# Patient Record
Sex: Male | Born: 1985 | Race: White | Hispanic: No | Marital: Married | Smoking: Former smoker
Health system: Southern US, Community
[De-identification: ages and names within clinical notes are randomized; demographics above are authoritative.]

## PROBLEM LIST (undated history)

## (undated) HISTORY — PX: TONSILLECTOMY: SUR1361

---
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
## 2010-06-13 ENCOUNTER — Encounter: Payer: Self-pay | Admitting: Orthopedic Surgery

## 2011-01-31 ENCOUNTER — Encounter: Payer: Self-pay | Admitting: *Deleted

## 2011-01-31 ENCOUNTER — Emergency Department (HOSPITAL_COMMUNITY): Payer: Self-pay

## 2011-01-31 ENCOUNTER — Emergency Department (HOSPITAL_COMMUNITY)
Admission: EM | Admit: 2011-01-31 | Discharge: 2011-01-31 | Disposition: A | Discharge status: Home / Self Care | Payer: Self-pay | Attending: Emergency Medicine | Admitting: Emergency Medicine

## 2011-01-31 DIAGNOSIS — R109 Unspecified abdominal pain: Secondary | ICD-10-CM | POA: Insufficient documentation

## 2011-01-31 DIAGNOSIS — R112 Nausea with vomiting, unspecified: Secondary | ICD-10-CM | POA: Insufficient documentation

## 2011-01-31 DIAGNOSIS — R197 Diarrhea, unspecified: Secondary | ICD-10-CM | POA: Insufficient documentation

## 2011-01-31 LAB — COMPREHENSIVE METABOLIC PANEL
ALT: 30 U/L (ref 0–53)
AST: 24 U/L (ref 0–37)
Albumin: 4.4 g/dL (ref 3.5–5.2)
Alkaline Phosphatase: 107 U/L (ref 39–117)
BUN: 13 mg/dL (ref 6–23)
CO2: 29 mEq/L (ref 19–32)
Calcium: 9.5 mg/dL (ref 8.4–10.5)
Chloride: 101 mEq/L (ref 96–112)
Creatinine, Ser: 0.95 mg/dL (ref 0.50–1.35)
GFR calc Af Amer: >60 mL/min (ref >60)
GFR calc non Af Amer: >60 mL/min (ref >60)
Glucose, Bld: 94 mg/dL (ref 70–99)
Potassium: 3.9 mEq/L (ref 3.5–5.1)
Sodium: 137 mEq/L (ref 135–145)
Total Bilirubin: 0.5 mg/dL (ref 0.3–1.2)
Total Protein: 7.3 g/dL (ref 6.0–8.3)

## 2011-01-31 LAB — URINALYSIS, ROUTINE W REFLEX MICROSCOPIC
Bilirubin Urine: NEGATIVE
Glucose, UA: NEGATIVE mg/dL
Hgb urine dipstick: NEGATIVE
Ketones, ur: NEGATIVE mg/dL
Leukocytes, UA: NEGATIVE
Nitrite: NEGATIVE
Protein, ur: NEGATIVE mg/dL
Specific Gravity, Urine: 1.025 (ref 1.005–1.030)
Urobilinogen, UA: 0.2 mg/dL (ref 0.0–1.0)
pH: 5.5 (ref 5.0–8.0)

## 2011-01-31 LAB — DIFFERENTIAL
Basophils Absolute: 0.1 10*3/uL (ref 0.0–0.1)
Basophils Relative: 1 % (ref 0–1)
Eosinophils Absolute: 0.3 10*3/uL (ref 0.0–0.7)
Eosinophils Relative: 4 % (ref 0–5)
Lymphocytes Relative: 32 % (ref 12–46)
Lymphs Abs: 2.1 10*3/uL (ref 0.7–4.0)
Monocytes Absolute: 0.4 10*3/uL (ref 0.1–1.0)
Monocytes Relative: 6 % (ref 3–12)
Neutro Abs: 3.8 10*3/uL (ref 1.7–7.7)
Neutrophils Relative %: 58 % (ref 43–77)

## 2011-01-31 LAB — CBC
HCT: 48.7 % (ref 39.0–52.0)
Hemoglobin: 17.2 g/dL — ABNORMAL HIGH (ref 13.0–17.0)
MCH: 28.4 pg (ref 26.0–34.0)
MCHC: 35.3 g/dL (ref 30.0–36.0)
MCV: 80.4 fL (ref 78.0–100.0)
Platelets: 306 10*3/uL (ref 150–400)
RBC: 6.06 MIL/uL — ABNORMAL HIGH (ref 4.22–5.81)
RDW: 12.5 % (ref 11.5–15.5)
WBC: 6.5 10*3/uL (ref 4.0–10.5)

## 2011-01-31 LAB — LIPASE, BLOOD: Lipase: 24 U/L (ref 11–59)

## 2011-01-31 MED ORDER — ONDANSETRON HCL 4 MG/2ML IJ SOLN
4.0000 mg | INTRAMUSCULAR | Status: DC | PRN
  Administered 2011-01-31: 4 mg via INTRAVENOUS
  Filled 2011-01-31: qty 2

## 2011-01-31 MED ORDER — SODIUM CHLORIDE 0.9 % IV BOLUS (SEPSIS)
1000.0000 mL | Freq: Once | INTRAVENOUS | Status: AC
  Administered 2011-01-31: 1000 mL via INTRAVENOUS

## 2011-01-31 MED ORDER — FAMOTIDINE 40 MG/5ML PO SUSR: 0.5000 mg/kg | Freq: Once | ORAL | Status: DC

## 2011-01-31 MED ORDER — ONDANSETRON HCL 4 MG PO TABS: 4.0000 mg | ORAL_TABLET | Freq: Four times a day (QID) | ORAL | Status: AC | PRN

## 2011-01-31 MED ORDER — FAMOTIDINE IN NACL 20-0.9 MG/50ML-% IV SOLN
20.0000 mg | Freq: Once | INTRAVENOUS | Status: AC
  Administered 2011-01-31: 20 mg via INTRAVENOUS
  Filled 2011-01-31: qty 50

## 2011-01-31 NOTE — ED Provider Notes (Signed)
History     CSN: [id number] Arrival date & time: 01/31/2011  6:20 PM  Chief Complaint  Patient presents with  . Abdominal Pain   HPI Pt was seen at 2010.  Per pt, c/o gradual onset and improvement in persistent upper abd "pain" x4 days.  Has been assoc with multiple intermittent daily episodes of N/V/D.  States the vomiting and diarrhea have improved, has been able to tol PO fluids despite nausea.  Denies back pain, no fevers, no CP/SOB, no rash, no black or bloody stools or emesis.     History reviewed. No pertinent past medical history.  Past Surgical History  Procedure Date  . Tonsillectomy      History  Substance Use Topics  . Smoking status: Never Smoker   . Smokeless tobacco: Not on file  . Alcohol Use: No     Review of Systems ROS: Statement: All systems negative except as marked or noted in the HPI; Constitutional: Negative for fever and chills.  Eyes: Negative for eye pain, redness and discharge. ; ; ENMT: Negative for ear pain, hoarseness, nasal congestion, sinus pressure and sore throat. ; ; Cardiovascular: Negative for chest pain, palpitations, diaphoresis, dyspnea and peripheral edema. ; ; Respiratory: Negative for cough, wheezing and stridor. ; ; Gastrointestinal: Positive for nausea, vomiting, diarrhea and abdominal pain, negative for blood in stool, hematemesis, jaundice and rectal bleeding. . ; ; Genitourinary: Negative for dysuria, flank pain and hematuria. ; ; Musculoskeletal: Negative for back pain and neck pain. Negative for swelling and trauma.; ; Skin: Negative for pruritus, rash, abrasions, blisters, bruising and skin lesion.; ; Neuro: Negative for headache, lightheadedness and neck stiffness. Negative for weakness, altered level of consciousness , altered mental status, extremity weakness, paresthesias, involuntary movement, seizure and syncope.     Physical Exam  BP 131/70  Pulse 63  Temp(Src) 98.6 F (37 C) (Oral)  Resp 19  Ht 5\' 11"  (1.803 m)  Wt 212  lb 6 oz (96.333 kg)  BMI 29.62 kg/m2  SpO2 100%  Physical Exam 2015: Physical examination:  Nursing notes reviewed; Vital signs and O2 SAT reviewed;  Constitutional: Well developed, Well nourished, Well hydrated, In no acute distress; Head:  Normocephalic, atraumatic; Eyes: EOMI, PERRL, No scleral icterus; ENMT: Mouth and pharynx normal, Mucous membranes moist; Neck: Supple, Full range of motion, No lymphadenopathy; Cardiovascular: Regular rate and rhythm, No murmur, rub, or gallop; Respiratory: Breath sounds clear & equal bilaterally, No rales, rhonchi, wheezes, or rub, Normal respiratory effort/excursion; Chest: Nontender, Movement normal; Abdomen: Soft, +tender mid-epigastric and LUQ to palp.  No rebound or guarding.  Nondistended, Normal bowel sounds; Extremities: Pulses normal, No tenderness, No edema, No calf edema or asymmetry.; Neuro: AA&Ox3, Major CN grossly intact.  No gross focal motor or sensory deficits in extremities.; Skin: Color normal, Warm, Dry   ED Course  Procedures  MDM MDM Reviewed: nursing note and vitals Interpretation: labs and x-ray   Results for orders placed during the hospital encounter of 01/31/11  COMPREHENSIVE METABOLIC PANEL      Component Value Range   Sodium 137  135 - 145 (mEq/L)   Potassium 3.9  3.5 - 5.1 (mEq/L)   Chloride 101  96 - 112 (mEq/L)   CO2 29  19 - 32 (mEq/L)   Glucose, Bld 94  70 - 99 (mg/dL)   BUN 13  6 - 23 (mg/dL)   Creatinine, Ser 4.09  0.50 - 1.35 (mg/dL)   Calcium 9.5  8.4 - 81.1 (mg/dL)  Total Protein 7.3  6.0 - 8.3 (g/dL)   Albumin 4.4  3.5 - 5.2 (g/dL)   AST 24  0 - 37 (U/L)   ALT 30  0 - 53 (U/L)   Alkaline Phosphatase 107  39 - 117 (U/L)   Total Bilirubin 0.5  0.3 - 1.2 (mg/dL)   GFR calc non Af Amer >60  >60 (mL/min)   GFR calc Af Amer >60  >60 (mL/min)  CBC      Component Value Range   WBC 6.5  4.0 - 10.5 (K/uL)   RBC 6.06 (*) 4.22 - 5.81 (MIL/uL)   Hemoglobin 17.2 (*) 13.0 - 17.0 (g/dL)   HCT 16.1  09.6 - 04.5  (%)   MCV 80.4  78.0 - 100.0 (fL)   MCH 28.4  26.0 - 34.0 (pg)   MCHC 35.3  30.0 - 36.0 (g/dL)   RDW 40.9  81.1 - 91.4 (%)   Platelets 306  150 - 400 (K/uL)  DIFFERENTIAL      Component Value Range   Neutrophils Relative 58  43 - 77 (%)   Neutro Abs 3.8  1.7 - 7.7 (K/uL)   Lymphocytes Relative 32  12 - 46 (%)   Lymphs Abs 2.1  0.7 - 4.0 (K/uL)   Monocytes Relative 6  3 - 12 (%)   Monocytes Absolute 0.4  0.1 - 1.0 (K/uL)   Eosinophils Relative 4  0 - 5 (%)   Eosinophils Absolute 0.3  0.0 - 0.7 (K/uL)   Basophils Relative 1  0 - 1 (%)   Basophils Absolute 0.1  0.0 - 0.1 (K/uL)  LIPASE, BLOOD      Component Value Range   Lipase 24  11 - 59 (U/L)  URINALYSIS, ROUTINE W REFLEX MICROSCOPIC      Component Value Range   Color, Urine YELLOW  YELLOW    Appearance CLEAR  CLEAR    Specific Gravity, Urine 1.025  1.005 - 1.030    pH 5.5  5.0 - 8.0    Glucose, UA NEGATIVE  NEGATIVE (mg/dL)   Hgb urine dipstick NEGATIVE  NEGATIVE    Bilirubin Urine NEGATIVE  NEGATIVE    Ketones, ur NEGATIVE  NEGATIVE (mg/dL)   Protein, ur NEGATIVE  NEGATIVE (mg/dL)   Urobilinogen, UA 0.2  0.0 - 1.0 (mg/dL)   Nitrite NEGATIVE  NEGATIVE    Leukocytes, UA NEGATIVE  NEGATIVE    Dg Abd Acute W/chest  01/31/2011  *RADIOLOGY REPORT*  Clinical Data: Lower abdominal pain, nausea and vomiting.  ACUTE ABDOMEN SERIES (ABDOMEN 2 VIEW & CHEST 1 VIEW)  Comparison: None.  Findings: Mild interstitial prominence on the right, may be exaggerated by technique.  Lungs are otherwise clear.  No focal consolidation, pleural effusion, or pneumothorax. Cardiomediastinal contours are within normal limits.  No free intraperitoneal air.  Nonobstructive bowel gas pattern. Organ outlines normal where seen.  No acute or aggressive appearing osseous abnormality.  IMPRESSION: Mild interstitial prominence on the right, may be exaggerated by technique. No focal consolidation.  Nonobstructive bowel gas pattern.  No free intraperitoneal air  identified.  Original Report Authenticated By: Waneta Martins, M.D.    9:36 PM:  Tol PO well while in ED.  No N/V/D while in ED.  Wants to go home now.  Dx testing d/w pt and family.  Questions answered.  Verb understanding, agreeable to d/c home with outpt f/u.  Requesting work note.    Nuriyah Hanline M     Laray Anger, DO  02/01/11 1316 

## 2011-01-31 NOTE — ED Notes (Signed)
LUQ pain radiating into lower abd x 3 days with nausea and diarrhea.  Denies vomiting.  States is having trouble sleeping.

## 2011-01-31 NOTE — ED Notes (Signed)
Pt given drink at this time. Family at bedside. Bed in low position, side rails up x2. NAD noted & no needs voiced at this time.

## 2011-01-31 NOTE — ED Notes (Signed)
Pt reports n/v/d that started on Thursday. Pt reports having chills also. NAD noted at this time.

## 2011-01-31 NOTE — ED Notes (Signed)
Pt had no problems w/ drinking. States his pain now only comes & goes.

## 2011-02-01 LAB — URINE CULTURE
Colony Count: NO GROWTH
Culture  Setup Time: 201209110228
Culture: NO GROWTH

## 2013-03-01 ENCOUNTER — Encounter (HOSPITAL_COMMUNITY): Payer: Self-pay | Admitting: Emergency Medicine

## 2013-03-01 ENCOUNTER — Emergency Department (HOSPITAL_COMMUNITY)
Admission: EM | Admit: 2013-03-01 | Discharge: 2013-03-01 | Disposition: A | Discharge status: Home / Self Care | Payer: Self-pay | Attending: Emergency Medicine | Admitting: Emergency Medicine

## 2013-03-01 DIAGNOSIS — S335XXA Sprain of ligaments of lumbar spine, initial encounter: Secondary | ICD-10-CM | POA: Insufficient documentation

## 2013-03-01 DIAGNOSIS — Y929 Unspecified place or not applicable: Secondary | ICD-10-CM | POA: Insufficient documentation

## 2013-03-01 DIAGNOSIS — S39012A Strain of muscle, fascia and tendon of lower back, initial encounter: Secondary | ICD-10-CM

## 2013-03-01 DIAGNOSIS — Y9389 Activity, other specified: Secondary | ICD-10-CM | POA: Insufficient documentation

## 2013-03-01 DIAGNOSIS — X500XXA Overexertion from strenuous movement or load, initial encounter: Secondary | ICD-10-CM | POA: Insufficient documentation

## 2013-03-01 MED ORDER — CYCLOBENZAPRINE HCL 10 MG PO TABS
10.0000 mg | ORAL_TABLET | Freq: Once | ORAL | Status: AC
  Administered 2013-03-01: 10 mg via ORAL
  Filled 2013-03-01: qty 1

## 2013-03-01 MED ORDER — OXYCODONE-ACETAMINOPHEN 5-325 MG PO TABS
2.0000 | ORAL_TABLET | Freq: Once | ORAL | Status: AC
  Administered 2013-03-01: 2 via ORAL
  Filled 2013-03-01: qty 2

## 2013-03-01 MED ORDER — CYCLOBENZAPRINE HCL 10 MG PO TABS: 10.0000 mg | ORAL_TABLET | Freq: Three times a day (TID) | ORAL | Status: DC | PRN

## 2013-03-01 MED ORDER — DICLOFENAC SODIUM 75 MG PO TBEC: 75.0000 mg | DELAYED_RELEASE_TABLET | Freq: Two times a day (BID) | ORAL | Status: DC

## 2013-03-01 NOTE — ED Notes (Addendum)
Pt reports back pain since Tuesday, having upper back pain at that time.  Took 2 of his sisters hydrocodone for the pain at that time, pain got better and today re-injured back moving something. Pain is now in lower back area.

## 2013-03-01 NOTE — Discharge Instructions (Signed)

## 2013-03-01 NOTE — ED Notes (Signed)
Pain low back, onset today when tried to move a couch.  Pain into both buttocks and into lt thigh.alert, NAD

## 2013-03-01 NOTE — ED Provider Notes (Signed)
CSN: [id number]     Arrival date & time 03/01/13  1758 History   First MD Initiated Contact with Patient 03/01/13 1822     Chief Complaint  Patient presents with  . Back Pain   (Consider location/radiation/quality/duration/timing/severity/associated sxs/prior Treatment) Patient is a 27 y.o. male presenting with back pain. The history is provided by the patient.  Back Pain Location:  Lumbar spine and sacro-iliac joint Quality:  Aching Radiates to:  Does not radiate Pain severity:  Moderate Pain is:  Same all the time Onset quality:  Sudden Duration:  1 day Timing:  Constant Progression:  Unchanged Chronicity:  New Context: lifting heavy objects and twisting   Context comment:  Pain of sudden onset after lifting a couch. Relieved by:  Nothing Worsened by:  Bending, twisting, standing, sitting and movement Ineffective treatments:  Narcotics Associated symptoms: no abdominal pain, no abdominal swelling, no bladder incontinence, no bowel incontinence, no chest pain, no dysuria, no fever, no headaches, no leg pain, no numbness, no paresthesias, no pelvic pain, no perianal numbness, no tingling and no weakness     History reviewed. No pertinent past medical history. Past Surgical History  Procedure Laterality Date  . Tonsillectomy     No family history on file. History  Substance Use Topics  . Smoking status: Never Smoker   . Smokeless tobacco: Not on file  . Alcohol Use: No    Review of Systems  Constitutional: Negative for fever.  Respiratory: Negative for shortness of breath.   Cardiovascular: Negative for chest pain.  Gastrointestinal: Negative for vomiting, abdominal pain, constipation and bowel incontinence.  Genitourinary: Negative for bladder incontinence, dysuria, hematuria, flank pain, decreased urine volume, difficulty urinating and pelvic pain.       No perineal numbness or incontinence of urine or feces  Musculoskeletal: Positive for back pain. Negative for  joint swelling, neck pain and neck stiffness.  Skin: Negative for rash.  Neurological: Negative for tingling, weakness, numbness, headaches and paresthesias.  All other systems reviewed and are negative.    Allergies  Review of patient's allergies indicates no known allergies.  Home Medications  No current outpatient prescriptions on file. BP 131/70  Pulse 118  Temp(Src) 99.3 F (37.4 C) (Oral)  Resp 20  Ht 5\' 11"  (1.803 m)  Wt 230 lb (104.327 kg)  BMI 32.09 kg/m2  SpO2 98% Physical Exam  Nursing note and vitals reviewed. Constitutional: He is oriented to person, place, and time. He appears well-developed and well-nourished. No distress.  HENT:  Head: Normocephalic and atraumatic.  Neck: Normal range of motion. Neck supple.  Cardiovascular: Normal rate, regular rhythm, normal heart sounds and intact distal pulses.   No murmur heard. Pulmonary/Chest: Effort normal and breath sounds normal. No respiratory distress. He exhibits no tenderness.  Abdominal: Soft. He exhibits no distension. There is no tenderness. There is no rebound and no guarding.  Musculoskeletal: He exhibits tenderness. He exhibits no edema.       Lumbar back: He exhibits tenderness and pain. He exhibits normal range of motion, no swelling, no deformity, no laceration and normal pulse.  ttp of the left  lumbar paraspinal muscles.  No spinal tenderness.  DP pulses are brisk and symmetrical.  Distal sensation intact.  Hip Flexors/Extensors are intact  Neurological: He is alert and oriented to person, place, and time. He has normal strength. No sensory deficit. He exhibits normal muscle tone. Coordination and gait normal.  Reflex Scores:      Patellar reflexes are 2+ on  the right side and 2+ on the left side.      Achilles reflexes are 2+ on the right side and 2+ on the left side. Skin: Skin is warm and dry. No rash noted.    ED Course  Procedures (including critical care time) Labs Review Labs Reviewed - No  data to display Imaging Review No results found.    MDM    Patient has ttp of the left lumbar paraspinal muscles.  No focal neuro deficits on exam.  Ambulates with a steady gait.   No concerning sx's for emergent neurological or infectious process.  Likely muscular strain.  Pt agrees to voltaren and flexeril and f/u with his PMD if needed.  Patient is feeling better and stable for discharge.   Phillp Dolores L. Kreston Ahrendt, PA-C 03/03/13 1224

## 2013-03-03 NOTE — ED Provider Notes (Signed)
Medical screening examination/treatment/procedure(s) were performed by non-physician practitioner and as supervising physician I was immediately available for consultation/collaboration.    Vida Roller, MD 03/03/13 [phone]

## 2013-12-26 ENCOUNTER — Emergency Department (HOSPITAL_COMMUNITY)
Admission: EM | Admit: 2013-12-26 | Discharge: 2013-12-26 | Disposition: A | Discharge status: Home / Self Care | Payer: Self-pay | Attending: Emergency Medicine | Admitting: Emergency Medicine

## 2013-12-26 ENCOUNTER — Encounter (HOSPITAL_COMMUNITY): Payer: Self-pay | Admitting: Emergency Medicine

## 2013-12-26 DIAGNOSIS — H547 Unspecified visual loss: Secondary | ICD-10-CM | POA: Insufficient documentation

## 2013-12-26 DIAGNOSIS — H5789 Other specified disorders of eye and adnexa: Secondary | ICD-10-CM | POA: Insufficient documentation

## 2013-12-26 MED ORDER — TETRACAINE HCL 0.5 % OP SOLN
OPHTHALMIC | Status: AC
  Administered 2013-12-26: 1 [drp] via OPHTHALMIC
  Filled 2013-12-26: qty 2

## 2013-12-26 MED ORDER — FLUORESCEIN SODIUM 1 MG OP STRP
ORAL_STRIP | OPHTHALMIC | Status: AC
  Administered 2013-12-26: 1
  Filled 2013-12-26: qty 1

## 2013-12-26 MED ORDER — TETRACAINE HCL 0.5 % OP SOLN
1.0000 [drp] | Freq: Once | OPHTHALMIC | Status: AC
  Administered 2013-12-26: 1 [drp] via OPHTHALMIC

## 2013-12-26 NOTE — Discharge Instructions (Signed)
Blurred Vision °You have been seen today complaining of blurred vision. This means you have a loss of ability to see small details.  °CAUSES  °Blurred vision can be a symptom of underlying eye problems, such as: °· Aging of the eye (presbyopia). °· Glaucoma. °· Cataracts. °· Eye infection. °· Eye-related migraine. °· Diabetes mellitus. °· Fatigue. °· Migraine headaches. °· High blood pressure. °· Breakdown of the back of the eye (macular degeneration). °· Problems caused by some medications. °The most common cause of blurred vision is the need for eyeglasses or a new prescription. Today in the emergency department, no cause for your blurred vision can be found. °SYMPTOMS  °Blurred vision is the loss of visual sharpness and detail (acuity). °DIAGNOSIS  °Should blurred vision continue, you should see your caregiver. If your caregiver is your primary care physician, he or she may choose to refer you to another specialist.  °TREATMENT  °Do not ignore your blurred vision. Make sure to have it checked out to see if further treatment or referral is necessary. °SEEK MEDICAL CARE IF:  °You are unable to get into a specialist so we can help you with a referral. °SEEK IMMEDIATE MEDICAL CARE IF: °You have severe eye pain, severe headache, or sudden loss of vision. °MAKE SURE YOU:  °· Understand these instructions. °· Will watch your condition. °· Will get help right away if you are not doing well or get worse. °Document Released: 05/12/2003 Document Revised: 08/01/2011 Document Reviewed: 12/12/2007 °[employer]® Patient Information ©2015 [employer]. This information is not intended to replace advice given to you by your health care provider. Make sure you discuss any questions you have with your health care provider. ° °

## 2013-12-26 NOTE — ED Notes (Signed)
Pt states he was hit with a rock to right eye a week ago while weed eating. Pt states, "I have peripheral vision but having trouble seeing straight ahead."

## 2013-12-26 NOTE — ED Provider Notes (Addendum)
CSN: [id number]     Arrival date & time 12/26/13  1355 History   First MD Initiated Contact with Patient 12/26/13 1411     Chief Complaint  Patient presents with  . Eye Problem     (Consider location/radiation/quality/duration/timing/severity/associated sxs/prior Treatment) Patient is a 28 y.o. male presenting with eye problem.  Eye Problem Location:  R eye Quality: central vision loss. Severity:  Moderate Onset quality:  Gradual Duration:  3 days Timing:  Constant Progression:  Worsening Chronicity:  New Context: direct trauma (1 week ago)   Relieved by:  Nothing Worsened by:  Nothing tried Ineffective treatments:  None tried Associated symptoms: blurred vision and decreased vision   Associated symptoms: no headaches, no nausea, no photophobia, no swelling, no tingling, no vomiting and no weakness   Risk factors: no conjunctival hemorrhage and no previous injury to eye     History reviewed. No pertinent past medical history. Past Surgical History  Procedure Laterality Date  . Tonsillectomy     No family history on file. History  Substance Use Topics  . Smoking status: Never Smoker   . Smokeless tobacco: Not on file  . Alcohol Use: No    Review of Systems  Constitutional: Negative for fever and chills.  HENT: Negative for congestion, rhinorrhea and sore throat.   Eyes: Positive for blurred vision. Negative for photophobia and visual disturbance.  Respiratory: Negative for cough and shortness of breath.   Cardiovascular: Negative for chest pain and leg swelling.  Gastrointestinal: Negative for nausea, vomiting, abdominal pain, diarrhea and constipation.  Endocrine: Negative for polydipsia and polyuria.  Genitourinary: Negative for dysuria and hematuria.  Musculoskeletal: Negative for arthralgias and back pain.  Skin: Negative for color change and rash.  Neurological: Negative for dizziness, tingling, syncope, weakness, light-headedness and headaches.   Hematological: Negative for adenopathy. Does not bruise/bleed easily.  All other systems reviewed and are negative.     Allergies  Review of patient's allergies indicates no known allergies.  Home Medications   Prior to Admission medications   Not on File   BP 143/84  Pulse 109  Temp(Src) 98.9 F (37.2 C) (Oral)  Resp 16  SpO2 98% Physical Exam  Vitals reviewed. Constitutional: He is oriented to person, place, and time. He appears well-developed and well-nourished.  HENT:  Head: Normocephalic and atraumatic.  Eyes: EOM are normal. Pupils are equal, round, and reactive to light. Right conjunctiva is injected. Pupils are equal.  Fundoscopic exam:      The right eye shows no arteriolar narrowing, no AV nicking, no exudate, no hemorrhage and no papilledema. The right eye shows no red reflex and no venous pulsations.  Slit lamp exam:      The right eye shows no corneal abrasion, no corneal flare, no corneal ulcer, no foreign body, no hyphema, no hypopyon, no fluorescein uptake and no anterior chamber bulge.  Neck: Normal range of motion. Neck supple.  Cardiovascular: Normal rate, regular rhythm and normal heart sounds.   Pulmonary/Chest: Effort normal and breath sounds normal. No respiratory distress.  Abdominal: He exhibits no distension. There is no tenderness. There is no rebound and no guarding.  Musculoskeletal: Normal range of motion.  Neurological: He is alert and oriented to person, place, and time.  Skin: Skin is warm and dry.    ED Course  Procedures (including critical care time) Labs Review Labs Reviewed - No data to display  Imaging Review No results found.   EKG Interpretation None  Ultrasound: Limited Ocular  Performed and interpreted by Dr Littie Deeds Indication: vision loss of R eye Using high frequency linear probe, ultrasound of the globe was performed in real time in two planes with patient looking left and right.  Interpretation: No retinal  detachment visualized.  Lens was in proper location.  No hemorrhage appreciated. Retinal sheath 2.6 mm Images were electronically archived.     MDM   Final diagnoses:  Vision problem    28 y.o. male  without pertinent PMH presents with painless central vision loss x3 days. Patient states approximately one week ago he was weed eating and hit himself in the right eye with with a rock. The patient had clearance of his pain, however 3 days ago was reading a book and noticed that he could not see centrally out of his right eye due to blurring.  [place] and slit lamp exams unremarkable.  VA 20/200 OD, 20/20 OS.  Spoke with ophthalmology Dr. Clarisa Kindred who has arranged for fu next week.  Pt voiced understanding of precautions and agreed to fu.    Labs and imaging as above reviewed.   1. Vision problem         Mirian Mo, MD 12/26/13 1624  Late addendum to reflect error in documentation.  This was insignificant to pt care.    Mirian Mo, MD 01/08/14 [phone]

## 2014-02-03 ENCOUNTER — Emergency Department (HOSPITAL_COMMUNITY)
Admission: EM | Admit: 2014-02-03 | Discharge: 2014-02-03 | Disposition: A | Discharge status: Home / Self Care | Payer: Self-pay | Attending: Emergency Medicine | Admitting: Emergency Medicine

## 2014-02-03 ENCOUNTER — Encounter (HOSPITAL_COMMUNITY): Payer: Self-pay | Admitting: Emergency Medicine

## 2014-02-03 DIAGNOSIS — R1110 Vomiting, unspecified: Secondary | ICD-10-CM | POA: Insufficient documentation

## 2014-02-03 DIAGNOSIS — K5289 Other specified noninfective gastroenteritis and colitis: Secondary | ICD-10-CM | POA: Insufficient documentation

## 2014-02-03 DIAGNOSIS — K529 Noninfective gastroenteritis and colitis, unspecified: Secondary | ICD-10-CM

## 2014-02-03 LAB — CBC WITH DIFFERENTIAL/PLATELET
Basophils Absolute: 0 10*3/uL (ref 0.0–0.1)
Basophils Relative: 1 % (ref 0–1)
Eosinophils Absolute: 0.6 10*3/uL (ref 0.0–0.7)
Eosinophils Relative: 8 % — ABNORMAL HIGH (ref 0–5)
HCT: 47.1 % (ref 39.0–52.0)
Hemoglobin: 17 g/dL (ref 13.0–17.0)
Lymphocytes Relative: 33 % (ref 12–46)
Lymphs Abs: 2.4 10*3/uL (ref 0.7–4.0)
MCH: 29.3 pg (ref 26.0–34.0)
MCHC: 36.1 g/dL — ABNORMAL HIGH (ref 30.0–36.0)
MCV: 81.1 fL (ref 78.0–100.0)
Monocytes Absolute: 0.5 10*3/uL (ref 0.1–1.0)
Monocytes Relative: 7 % (ref 3–12)
Neutro Abs: 3.7 10*3/uL (ref 1.7–7.7)
Neutrophils Relative %: 51 % (ref 43–77)
Platelets: 248 10*3/uL (ref 150–400)
RBC: 5.81 MIL/uL (ref 4.22–5.81)
RDW: 12.8 % (ref 11.5–15.5)
WBC: 7.2 10*3/uL (ref 4.0–10.5)

## 2014-02-03 LAB — BASIC METABOLIC PANEL
Anion gap: 10 (ref 5–15)
BUN: 10 mg/dL (ref 6–23)
CO2: 29 mEq/L (ref 19–32)
Calcium: 9.3 mg/dL (ref 8.4–10.5)
Chloride: 104 mEq/L (ref 96–112)
Creatinine, Ser: 1 mg/dL (ref 0.50–1.35)
GFR calc Af Amer: >90 mL/min (ref >90)
GFR calc non Af Amer: >90 mL/min (ref >90)
Glucose, Bld: 85 mg/dL (ref 70–99)
Potassium: 4.5 mEq/L (ref 3.7–5.3)
Sodium: 143 mEq/L (ref 137–147)

## 2014-02-03 MED ORDER — SODIUM CHLORIDE 0.9 % IV BOLUS (SEPSIS)
2000.0000 mL | Freq: Once | INTRAVENOUS | Status: AC
  Administered 2014-02-03: 2000 mL via INTRAVENOUS

## 2014-02-03 MED ORDER — SODIUM CHLORIDE 0.9 % IV SOLN: INTRAVENOUS | Status: DC

## 2014-02-03 MED ORDER — PROMETHAZINE HCL 25 MG PO TABS: 25.0000 mg | ORAL_TABLET | Freq: Four times a day (QID) | ORAL | Status: DC | PRN

## 2014-02-03 MED ORDER — ONDANSETRON HCL 4 MG/2ML IJ SOLN
4.0000 mg | Freq: Once | INTRAMUSCULAR | Status: AC
  Administered 2014-02-03: 4 mg via INTRAVENOUS
  Filled 2014-02-03: qty 2

## 2014-02-03 MED ORDER — ONDANSETRON HCL 4 MG/2ML IJ SOLN: 4.0000 mg | Freq: Once | INTRAMUSCULAR | Status: DC

## 2014-02-03 MED ORDER — SODIUM CHLORIDE 0.9 % IV BOLUS (SEPSIS): 1000.0000 mL | Freq: Once | INTRAVENOUS | Status: DC

## 2014-02-03 NOTE — ED Notes (Signed)
Vomiting, fever, nausea,   Sl diarreha.

## 2014-02-03 NOTE — Discharge Instructions (Signed)
Take the Phenergan is needed for the nausea. Continue to hydrate yourself. Advance to bland diet when tolerating liquids fine. Return for any new or worse symptoms.

## 2014-02-03 NOTE — ED Provider Notes (Signed)
CSN: [id number]     Arrival date & time 02/03/14  1441 History   First MD Initiated Contact with Patient 02/03/14 1537     Chief Complaint  Patient presents with  . Emesis     (Consider location/radiation/quality/duration/timing/severity/associated sxs/prior Treatment) HPI 28 year old male with 24 hours of multiple episodes of nonbloody vomiting and diarrhea with intermittent crampy abdominal pain without constant or localized abdominal pain and no abdominal pain now he does feel dehydrated with some lightheadedness he states multiple people at work have the same problem been sick for 2-3 days, he states he thinks he had a fever last night with some chills, he has no cough no shortness of breath no abdominal pain now no scrotal pain no bloody vomiting no bloody stools no body aches no other concerns with no treatment pressure arrival. He prefers IV fluids since he has already tried oral hydration at home prior to arrival. History reviewed. No pertinent past medical history. Past Surgical History  Procedure Laterality Date  . Tonsillectomy     History reviewed. No pertinent family history. History  Substance Use Topics  . Smoking status: Never Smoker   . Smokeless tobacco: Not on file  . Alcohol Use: No    Review of Systems 10 Systems reviewed and are negative for acute change except as noted in the HPI.   Allergies  Review of patient's allergies indicates no known allergies.  Home Medications   Prior to Admission medications   Medication Sig Start Date End Date Taking? Authorizing Provider  promethazine (PHENERGAN) 25 MG tablet Take 1 tablet (25 mg total) by mouth every 6 (six) hours as needed. 02/03/14   Vanetta Mulders, MD   BP 109/73  Pulse 60  Temp(Src) 98.7 F (37.1 C) (Oral)  Resp 18  Ht  (1.803 m)  Wt 218 lb (98.884 kg)  BMI 30.42 kg/m2  SpO2 100% Physical Exam  Nursing note and vitals reviewed. Constitutional:  Awake, alert, nontoxic appearance.  HENT:   Head: Atraumatic.  Eyes: Right eye exhibits no discharge. Left eye exhibits no discharge.  Neck: Neck supple.  Cardiovascular: Normal rate and regular rhythm.   No murmur heard. Pulmonary/Chest: Effort normal and breath sounds normal. No respiratory distress. He has no wheezes. He has no rales. He exhibits no tenderness.  Abdominal: Soft. Bowel sounds are normal. He exhibits no distension and no mass. There is no tenderness. There is no rebound and no guarding.  Genitourinary:  No scrotal tenderness  Musculoskeletal: He exhibits no tenderness.  Baseline ROM, no obvious new focal weakness.  Neurological: He is alert.  Mental status and motor strength appears baseline for patient and situation.  Skin: No rash noted.  Psychiatric: He has a normal mood and affect.    ED Course  Procedures (including critical care time) Patient understand and agree with initial ED impression and plan with expectations set for ED visit. Pt discharged by Dr. Deretha Emory.  Labs Review Labs Reviewed  CBC WITH DIFFERENTIAL - Abnormal; Notable for the following:    MCHC 36.1 (*)    Eosinophils Relative 8 (*)    All other components within normal limits  BASIC METABOLIC PANEL    Imaging Review No results found.   EKG Interpretation None      MDM   Final diagnoses:  Gastroenteritis    I doubt any other EMC precluding discharge at this time including, but not necessarily limited to the following: SBI, peritonitis.    Hurman Horn, MD 02/05/14 [phone]

## 2014-02-03 NOTE — ED Provider Notes (Signed)
Outpatient discharge by me. Patient improved after 2 L of fluid. Patient said Zofran x2. This is most likely represents a viral gastroenteritis. Patient's had sick exposures at work with people very similar illness. No significant abdominal tenderness or any evidence of an acute surgical abdomen. Patient discharged to home diagnosis gastroenteritis.  Patient we discharged home with Phenergan and a work note for 2 days.  Vanetta Mulders, MD 02/03/14 [phone]

## 2014-12-29 ENCOUNTER — Emergency Department (HOSPITAL_COMMUNITY)
Admission: EM | Admit: 2014-12-29 | Discharge: 2014-12-30 | Disposition: A | Discharge status: Home / Self Care | Payer: Self-pay | Attending: Emergency Medicine | Admitting: Emergency Medicine

## 2014-12-29 ENCOUNTER — Encounter (HOSPITAL_COMMUNITY): Payer: Self-pay | Admitting: Emergency Medicine

## 2014-12-29 DIAGNOSIS — Y998 Other external cause status: Secondary | ICD-10-CM | POA: Insufficient documentation

## 2014-12-29 DIAGNOSIS — Y9389 Activity, other specified: Secondary | ICD-10-CM | POA: Insufficient documentation

## 2014-12-29 DIAGNOSIS — Y9289 Other specified places as the place of occurrence of the external cause: Secondary | ICD-10-CM | POA: Insufficient documentation

## 2014-12-29 DIAGNOSIS — M419 Scoliosis, unspecified: Secondary | ICD-10-CM | POA: Insufficient documentation

## 2014-12-29 DIAGNOSIS — S39012A Strain of muscle, fascia and tendon of lower back, initial encounter: Secondary | ICD-10-CM | POA: Insufficient documentation

## 2014-12-29 DIAGNOSIS — X58XXXA Exposure to other specified factors, initial encounter: Secondary | ICD-10-CM | POA: Insufficient documentation

## 2014-12-29 NOTE — ED Notes (Signed)
Pt c/o lower back pain x 2 weeks 

## 2014-12-30 ENCOUNTER — Emergency Department (HOSPITAL_COMMUNITY): Payer: Self-pay

## 2014-12-30 MED ORDER — BACLOFEN 10 MG PO TABS: 10.0000 mg | ORAL_TABLET | Freq: Three times a day (TID) | ORAL | Status: AC

## 2014-12-30 MED ORDER — KETOROLAC TROMETHAMINE 10 MG PO TABS
10.0000 mg | ORAL_TABLET | Freq: Once | ORAL | Status: AC
  Administered 2014-12-30: 10 mg via ORAL
  Filled 2014-12-30: qty 1

## 2014-12-30 MED ORDER — MORPHINE SULFATE 4 MG/ML IJ SOLN
8.0000 mg | Freq: Once | INTRAMUSCULAR | Status: AC
  Administered 2014-12-30: 8 mg via INTRAMUSCULAR
  Filled 2014-12-30: qty 2

## 2014-12-30 MED ORDER — ACETAMINOPHEN-CODEINE #3 300-30 MG PO TABS: 1.0000 | ORAL_TABLET | Freq: Four times a day (QID) | ORAL | Status: DC | PRN

## 2014-12-30 MED ORDER — DIAZEPAM 5 MG PO TABS
5.0000 mg | ORAL_TABLET | Freq: Once | ORAL | Status: AC
  Administered 2014-12-30: 5 mg via ORAL
  Filled 2014-12-30: qty 1

## 2014-12-30 MED ORDER — ONDANSETRON HCL 4 MG PO TABS
4.0000 mg | ORAL_TABLET | Freq: Once | ORAL | Status: AC
  Administered 2014-12-30: 4 mg via ORAL
  Filled 2014-12-30: qty 1

## 2014-12-30 NOTE — Discharge Instructions (Signed)
Heating pad to your lower back maybe helpful, please rest your back as much as possible. Please use 600 mg of ibuprofen with breakfast, lunch, dinner, and at bedtime. Please use baclofen 3 times daily for spasm pain. May use Tylenol codeine for more severe pain. Please see Dr. Eulah Pont, or the orthopedic specialist of your choice concerning your lower back.  Muscle Strain A muscle strain is an injury that occurs when a muscle is stretched beyond its normal length. Usually a small number of muscle fibers are torn when this happens. Muscle strain is rated in degrees. First-degree strains have the least amount of muscle fiber tearing and pain. Second-degree and third-degree strains have increasingly more tearing and pain.  Usually, recovery from muscle strain takes 1-2 weeks. Complete healing takes 5-6 weeks.  CAUSES  Muscle strain happens when a sudden, violent force placed on a muscle stretches it too far. This may occur with lifting, sports, or a fall.  RISK FACTORS Muscle strain is especially common in athletes.  SIGNS AND SYMPTOMS At the site of the muscle strain, there may be:  Pain.  Bruising.  Swelling.  Difficulty using the muscle due to pain or lack of normal function. DIAGNOSIS  Your health care provider will perform a physical exam and ask about your medical history. TREATMENT  Often, the best treatment for a muscle strain is resting, icing, and applying cold compresses to the injured area.  HOME CARE INSTRUCTIONS   Use the PRICE method of treatment to promote muscle healing during the first 2-3 days after your injury. The PRICE method involves:  Protecting the muscle from being injured again.  Restricting your activity and resting the injured body part.  Icing your injury. To do this, put ice in a plastic bag. Place a towel between your skin and the bag. Then, apply the ice and leave it on from 15-20 minutes each hour. After the third day, switch to moist heat packs.  Apply  compression to the injured area with a splint or elastic bandage. Be careful not to wrap it too tightly. This may interfere with blood circulation or increase swelling.  Elevate the injured body part above the level of your heart as often as you can.  Only take over-the-counter or prescription medicines for pain, discomfort, or fever as directed by your health care provider.  Warming up prior to exercise helps to prevent future muscle strains. SEEK MEDICAL CARE IF:   You have increasing pain or swelling in the injured area.  You have numbness, tingling, or a significant loss of strength in the injured area. MAKE SURE YOU:   Understand these instructions.  Will watch your condition.  Will get help right away if you are not doing well or get worse. Document Released: 05/09/2005 Document Revised: 02/27/2013 Document Reviewed: 12/06/2012 [facility] Patient Information 2015 [place], [place]. This information is not intended to replace advice given to you by your health care provider. Make sure you discuss any questions you have with your health care provider.

## 2014-12-30 NOTE — ED Provider Notes (Signed)
CSN: [id number]     Arrival date & time 12/29/14  2323 History   First MD Initiated Contact with Patient 12/29/14 2357     Chief Complaint  Patient presents with  . Back Pain     (Consider location/radiation/quality/duration/timing/severity/associated sxs/prior Treatment) Patient is a 29 y.o. male presenting with back pain. The history is provided by the patient.  Back Pain Location:  Lumbar spine Quality:  Aching and shooting Radiates to:  L thigh Pain severity:  Moderate Pain is:  Same all the time Duration:  2 weeks Timing:  Intermittent Progression:  Worsening Chronicity:  New Context: lifting heavy objects and occupational injury   Relieved by:  Nothing Worsened by:  Movement and palpation Ineffective treatments:  Being still and lying down Associated symptoms: no abdominal pain, no bladder incontinence, no bowel incontinence, no chest pain, no dysuria and no paresthesias   Risk factors: no recent surgery and no steroid use     History reviewed. No pertinent past medical history. Past Surgical History  Procedure Laterality Date  . Tonsillectomy     No family history on file. History  Substance Use Topics  . Smoking status: Never Smoker   . Smokeless tobacco: Not on file  . Alcohol Use: No    Review of Systems  Constitutional: Negative for activity change.       All ROS Neg except as noted in HPI  HENT: Negative for nosebleeds.   Eyes: Negative for photophobia and discharge.  Respiratory: Negative for cough, shortness of breath and wheezing.   Cardiovascular: Negative for chest pain and palpitations.  Gastrointestinal: Negative for abdominal pain, blood in stool and bowel incontinence.  Genitourinary: Negative for bladder incontinence, dysuria, frequency and hematuria.  Musculoskeletal: Positive for back pain. Negative for arthralgias and neck pain.  Skin: Negative.   Neurological: Negative for dizziness, seizures, speech difficulty and paresthesias.    Psychiatric/Behavioral: Negative for hallucinations and confusion.      Allergies  Review of patient's allergies indicates no known allergies.  Home Medications   Prior to Admission medications   Medication Sig Start Date End Date Taking? Authorizing Provider  promethazine (PHENERGAN) 25 MG tablet Take 1 tablet (25 mg total) by mouth every 6 (six) hours as needed. 02/03/14   Vanetta Mulders, MD   BP 121/81 mmHg  Pulse 74  Temp(Src) 98 F (36.7 C) (Oral)  Resp 18  Ht 5\' 11"  (1.803 m)  Wt 205 lb (92.987 kg)  BMI 28.60 kg/m2  SpO2 100% Physical Exam  Constitutional: He is oriented to person, place, and time. He appears well-developed and well-nourished.  Non-toxic appearance.  HENT:  Head: Normocephalic.  Right Ear: Tympanic membrane and external ear normal.  Left Ear: Tympanic membrane and external ear normal.  Eyes: EOM and lids are normal. Pupils are equal, round, and reactive to light.  Neck: Normal range of motion. Neck supple. Carotid bruit is not present.  Cardiovascular: Normal rate, regular rhythm, normal heart sounds, intact distal pulses and normal pulses.   Pulmonary/Chest: Breath sounds normal. No respiratory distress.  Abdominal: Soft. Bowel sounds are normal. There is no tenderness. There is no guarding.  Musculoskeletal:       Lumbar back: He exhibits decreased range of motion, tenderness, pain and spasm. He exhibits no deformity.       Back:  Lymphadenopathy:       Head (right side): No submandibular adenopathy present.       Head (left side): No submandibular adenopathy present.  He has no cervical adenopathy.  Neurological: He is alert and oriented to person, place, and time. He has normal strength. No cranial nerve deficit or sensory deficit.  Skin: Skin is warm and dry.  Psychiatric: He has a normal mood and affect. His speech is normal.  Nursing note and vitals reviewed.   ED Course  Procedures (including critical care time) Labs Review Labs  Reviewed - No data to display  Imaging Review No results found.   EKG Interpretation None      MDM  Vital signs stable. Xray of the l spine reveals mild scoliosis. No fx or dislocation. No gross neuro deficit noted. Pt fitted with crutches. Rx for tylenol codeine and baclofen given to the patient. Pt referred to orthopedics.   Final diagnoses:  None    **I have reviewed nursing notes, vital signs, and all appropriate lab and imaging results for this patient.Ivery Quale, PA-C 12/30/14 1741  Devoria Albe, MD 12/30/14 2258

## 2017-05-30 ENCOUNTER — Emergency Department (HOSPITAL_COMMUNITY): Payer: Self-pay

## 2017-05-30 ENCOUNTER — Emergency Department (HOSPITAL_COMMUNITY)
Admission: EM | Admit: 2017-05-30 | Discharge: 2017-05-30 | Disposition: A | Discharge status: Home / Self Care | Payer: Self-pay | Attending: Emergency Medicine | Admitting: Emergency Medicine

## 2017-05-30 ENCOUNTER — Encounter (HOSPITAL_COMMUNITY): Payer: Self-pay | Admitting: Emergency Medicine

## 2017-05-30 DIAGNOSIS — J40 Bronchitis, not specified as acute or chronic: Secondary | ICD-10-CM

## 2017-05-30 DIAGNOSIS — Z87891 Personal history of nicotine dependence: Secondary | ICD-10-CM | POA: Insufficient documentation

## 2017-05-30 DIAGNOSIS — J209 Acute bronchitis, unspecified: Secondary | ICD-10-CM | POA: Insufficient documentation

## 2017-05-30 MED ORDER — PREDNISONE 20 MG PO TABS
40.0000 mg | ORAL_TABLET | Freq: Once | ORAL | Status: AC
  Administered 2017-05-30: 40 mg via ORAL
  Filled 2017-05-30: qty 2

## 2017-05-30 MED ORDER — BENZONATATE 200 MG PO CAPS: 200.0000 mg | ORAL_CAPSULE | Freq: Three times a day (TID) | ORAL | 0 refills | Status: DC | PRN

## 2017-05-30 MED ORDER — BENZONATATE 100 MG PO CAPS
200.0000 mg | ORAL_CAPSULE | Freq: Once | ORAL | Status: AC
  Administered 2017-05-30: 200 mg via ORAL
  Filled 2017-05-30: qty 2

## 2017-05-30 MED ORDER — IPRATROPIUM-ALBUTEROL 0.5-2.5 (3) MG/3ML IN SOLN
3.0000 mL | Freq: Once | RESPIRATORY_TRACT | Status: AC
  Administered 2017-05-30: 3 mL via RESPIRATORY_TRACT
  Filled 2017-05-30: qty 3

## 2017-05-30 MED ORDER — ALBUTEROL SULFATE HFA 108 (90 BASE) MCG/ACT IN AERS
2.0000 | INHALATION_SPRAY | Freq: Once | RESPIRATORY_TRACT | Status: AC
  Administered 2017-05-30: 2 via RESPIRATORY_TRACT
  Filled 2017-05-30: qty 6.7

## 2017-05-30 MED ORDER — PREDNISONE 20 MG PO TABS: 40.0000 mg | ORAL_TABLET | Freq: Every day | ORAL | 0 refills | Status: DC

## 2017-05-30 MED ORDER — ALBUTEROL SULFATE (2.5 MG/3ML) 0.083% IN NEBU
2.5000 mg | INHALATION_SOLUTION | Freq: Once | RESPIRATORY_TRACT | Status: AC
  Administered 2017-05-30: 2.5 mg via RESPIRATORY_TRACT
  Filled 2017-05-30: qty 3

## 2017-05-30 NOTE — ED Triage Notes (Signed)
Pt reports nonproductive cough x 2 weeks, worsening over the past few days.

## 2017-05-30 NOTE — ED Provider Notes (Signed)
[facility] EMERGENCY DEPARTMENT Provider Note   CSN: [id number] Arrival date & time: 05/30/17  0735     History   Chief Complaint Chief Complaint  Patient presents with  . Cough    HPI Joseph Hardy is a 32 y.o. male.  HPI   Joseph Hardy is a 31 y.o. male who presents to the Emergency Department complaining of persistent cough for 2 weeks.  Describes a mostly non-productive cough that seems to be worse at night.  States he wakes up frequently coughing and "can't catch my breath"  Symptoms are associated with sore throat, and pain to his bilateral ribs when coughing.  He has tried OTC cold or cough medications without significant relief.  Also reports intermittent chills and sweats.  He denies shortness of breath, known fever, hemoptysis.    History reviewed. No pertinent past medical history.  There are no active problems to display for this patient.   Past Surgical History:  Procedure Laterality Date  . TONSILLECTOMY         Home Medications    Prior to Admission medications   Medication Sig Start Date End Date Taking? Authorizing Provider  acetaminophen-codeine (TYLENOL #3) 300-30 MG per tablet Take 1-2 tablets by mouth every 6 (six) hours as needed. 12/30/14   Ivery Quale, PA-C  promethazine (PHENERGAN) 25 MG tablet Take 1 tablet (25 mg total) by mouth every 6 (six) hours as needed. 02/03/14   Vanetta Mulders, MD    Family History History reviewed. No pertinent family history.  Social History Social History   Tobacco Use  . Smoking status: Former Smoker    Types: Cigarettes    Last attempt to quit: 05/16/2017    Years since quitting: 0.0  Substance Use Topics  . Alcohol use: No  . Drug use: No     Allergies   Patient has no known allergies.   Review of Systems Review of Systems  Constitutional: Positive for chills. Negative for appetite change and fever.  HENT: Positive for congestion and sore throat. Negative for trouble  swallowing.   Respiratory: Positive for cough, chest tightness and wheezing. Negative for shortness of breath.   Cardiovascular: Negative for chest pain.  Gastrointestinal: Negative for abdominal pain, nausea and vomiting.  Genitourinary: Negative for dysuria and flank pain.  Musculoskeletal: Positive for myalgias. Negative for arthralgias.  Skin: Negative for rash.  Neurological: Negative for dizziness, weakness, numbness and headaches.  Hematological: Negative for adenopathy.  All other systems reviewed and are negative.    Physical Exam Updated Vital Signs BP 133/85 (BP Location: Right Arm)   Pulse (!) 106   Temp 98.5 F (36.9 C) (Oral)   Resp (!) 22   Ht 5\' 11"  (1.803 m)   Wt 107 kg (236 lb)   SpO2 94%   BMI 32.92 kg/m   Physical Exam  Constitutional: He is oriented to person, place, and time. He appears well-developed and well-nourished. No distress.  HENT:  Head: Normocephalic and atraumatic.  Right Ear: Tympanic membrane and ear canal normal.  Left Ear: Tympanic membrane and ear canal normal.  Mouth/Throat: Uvula is midline, oropharynx is clear and moist and mucous membranes are normal. No oropharyngeal exudate.  Eyes: EOM are normal. Pupils are equal, round, and reactive to light.  Neck: Normal range of motion, full passive range of motion without pain and phonation normal. Neck supple.  Cardiovascular: Normal rate, regular rhythm and intact distal pulses.  No murmur heard. Pulmonary/Chest: Effort normal. No stridor. No  respiratory distress. He has wheezes. He has no rales. He exhibits no tenderness.  Inspiratory and expiratory wheezes bilaterally and course lung sounds on right.   Abdominal: Soft. He exhibits no distension. There is no tenderness.  Musculoskeletal: Normal range of motion. He exhibits no edema.  Lymphadenopathy:    He has no cervical adenopathy.  Neurological: He is alert and oriented to person, place, and time. No sensory deficit. He exhibits  normal muscle tone. Coordination normal.  Skin: Skin is warm and dry. Capillary refill takes less than 2 seconds. No rash noted.  Nursing note and vitals reviewed.    ED Treatments / Results  Labs (all labs ordered are listed, but only abnormal results are displayed) Labs Reviewed - No data to display  EKG  EKG Interpretation None       Radiology Dg Chest 2 View  Result Date: 05/30/2017 CLINICAL DATA:  Nonproductive cough for 2 weeks EXAM: CHEST  2 VIEW COMPARISON:  None. FINDINGS: Heart and mediastinal contours are within normal limits. No focal opacities or effusions. No acute bony abnormality. IMPRESSION: No active cardiopulmonary disease. Electronically Signed   By: Charlett NoseKevin  Dover M.D.   On: 05/30/2017 08:05    Procedures Procedures (including critical care time)  Medications Ordered in ED Medications  ipratropium-albuterol (DUONEB) 0.5-2.5 (3) MG/3ML nebulizer solution 3 mL (not administered)  albuterol (PROVENTIL) (2.5 MG/3ML) 0.083% nebulizer solution 2.5 mg (not administered)  albuterol (PROVENTIL HFA;VENTOLIN HFA) 108 (90 Base) MCG/ACT inhaler 2 puff (not administered)  benzonatate (TESSALON) capsule 200 mg (200 mg Oral Given 05/30/17 0824)  predniSONE (DELTASONE) tablet 40 mg (40 mg Oral Given 05/30/17 0824)     Initial Impression / Assessment and Plan / ED Course  I have reviewed the triage vital signs and the nursing notes.  Pertinent labs & imaging results that were available during my care of the patient were reviewed by me and considered in my medical decision making (see chart for details).     On recheck, patient appears to be feeling better.  Lung sounds have improved.  Afebrile, no hypoxia or tachypnea.  He appears safe for discharge home.  Sx's likely viral. Will prescribe prednisone and albuterol MDI dispensed for home use.  Return precautions discussed.  Final Clinical Impressions(s) / ED Diagnoses   Final diagnoses:  Bronchitis    ED Discharge  Orders    None       Pauline Ausriplett, Ethyn Schetter, PA-C 05/30/17 0900    Raeford RazorKohut, Stephen, MD 05/30/17 1357

## 2017-05-30 NOTE — Discharge Instructions (Signed)
Take Tylenol or ibuprofen as directed if needed for fever.  Drink plenty of fluids.  2 puffs of the albuterol inhaler 4 times a day as needed.  Follow-up with your doctor or return here for any worsening symptoms.

## 2017-07-11 ENCOUNTER — Other Ambulatory Visit: Payer: Self-pay

## 2017-07-11 ENCOUNTER — Encounter (HOSPITAL_COMMUNITY): Payer: Self-pay | Admitting: Emergency Medicine

## 2017-07-11 ENCOUNTER — Emergency Department (HOSPITAL_COMMUNITY)
Admission: EM | Admit: 2017-07-11 | Discharge: 2017-07-11 | Disposition: A | Discharge status: Home / Self Care | Payer: Self-pay | Attending: Emergency Medicine | Admitting: Emergency Medicine

## 2017-07-11 DIAGNOSIS — T148XXA Other injury of unspecified body region, initial encounter: Secondary | ICD-10-CM

## 2017-07-11 DIAGNOSIS — Z87891 Personal history of nicotine dependence: Secondary | ICD-10-CM | POA: Insufficient documentation

## 2017-07-11 DIAGNOSIS — M62830 Muscle spasm of back: Secondary | ICD-10-CM | POA: Insufficient documentation

## 2017-07-11 MED ORDER — KETOROLAC TROMETHAMINE 60 MG/2ML IM SOLN
30.0000 mg | Freq: Once | INTRAMUSCULAR | Status: AC
  Administered 2017-07-11: 30 mg via INTRAMUSCULAR
  Filled 2017-07-11: qty 2

## 2017-07-11 MED ORDER — METHOCARBAMOL 500 MG PO TABS: 500.0000 mg | ORAL_TABLET | Freq: Every evening | ORAL | 0 refills | Status: DC | PRN

## 2017-07-11 MED ORDER — NAPROXEN 500 MG PO TABS: 500.0000 mg | ORAL_TABLET | Freq: Two times a day (BID) | ORAL | 0 refills | Status: DC

## 2017-07-11 MED ORDER — MENTHOL (TOPICAL ANALGESIC) 7.5 % EX CREA: 1.0000 "application " | TOPICAL_CREAM | Freq: Two times a day (BID) | CUTANEOUS | 0 refills | Status: DC | PRN

## 2017-07-11 NOTE — ED Notes (Signed)
Pt states he pulled something in his back a few days ago. Today he twisted to put seat belt on & now having increased pain w/ movement. Pt denies any problems w/ bowel or bladder function.

## 2017-07-11 NOTE — ED Triage Notes (Signed)
Mid back pain since yesterday

## 2017-07-11 NOTE — ED Provider Notes (Signed)
[facility] EMERGENCY DEPARTMENT Provider Note   CSN: [id number] Arrival date & time: 07/11/17  1602     History   Chief Complaint Chief Complaint  Patient presents with  . Back Pain    HPI Joseph Hardy is a 32 y.o. male with no past medical history presenting with gradual onset right-sided thoracic back pain.  He explains that he felt like he pulled it the day before yesterday. He does heavy lifting and operates a Runner, broadcasting/film/video at work.  He denies any fall or trauma. initially experiencing a dull pain but was able to work through with until this afternoon when he went to grab his seatbelt twisting his torso and felt a sharp pain and his back locking.  He denies any loss of bowel or bladder function, fever, chills, history of IV drug use, numbness or weakness, or difficulty walking.  He explains that he had a low back strain on the left side before which felt similar.  Has not tried anything for his symptoms yet.  HPI  History reviewed. No pertinent past medical history.  There are no active problems to display for this patient.   Past Surgical History:  Procedure Laterality Date  . TONSILLECTOMY         Home Medications    Prior to Admission medications   Medication Sig Start Date End Date Taking? Authorizing Provider  acetaminophen-codeine (TYLENOL #3) 300-30 MG per tablet Take 1-2 tablets by mouth every 6 (six) hours as needed. 12/30/14   Ivery Quale, PA-C  benzonatate (TESSALON) 200 MG capsule Take 1 capsule (200 mg total) by mouth 3 (three) times daily as needed for cough. Swallow whole, do not chew 05/30/17   Triplett, Tammy, PA-C  Menthol, Topical Analgesic, (ICY HOT NATURALS) 7.5 % CREA Apply 1 application topically 2 (two) times daily as needed. 07/11/17   Georgiana Shore, PA-C  methocarbamol (ROBAXIN) 500 MG tablet Take 1 tablet (500 mg total) by mouth at bedtime as needed. 07/11/17   Mathews Robinsons B, PA-C  naproxen (NAPROSYN) 500 MG tablet Take 1 tablet (500  mg total) by mouth 2 (two) times daily. 07/11/17   Georgiana Shore, PA-C  predniSONE (DELTASONE) 20 MG tablet Take 2 tablets (40 mg total) by mouth daily. 05/30/17   Triplett, Tammy, PA-C  promethazine (PHENERGAN) 25 MG tablet Take 1 tablet (25 mg total) by mouth every 6 (six) hours as needed. 02/03/14   Vanetta Mulders, MD    Family History No family history on file.  Social History Social History   Tobacco Use  . Smoking status: Former Smoker    Types: Cigarettes    Last attempt to quit: 05/16/2017    Years since quitting: 0.1  . Smokeless tobacco: Never Used  Substance Use Topics  . Alcohol use: No  . Drug use: No     Allergies   Patient has no known allergies.   Review of Systems Review of Systems  Constitutional: Negative for chills, diaphoresis, fatigue and fever.  Respiratory: Negative for cough, chest tightness, shortness of breath, wheezing and stridor.   Cardiovascular: Negative for chest pain, palpitations and leg swelling.  Gastrointestinal: Negative for abdominal pain, nausea and vomiting.  Genitourinary: Negative for difficulty urinating, dysuria, flank pain, frequency and hematuria.  Musculoskeletal: Positive for back pain and myalgias. Negative for arthralgias, gait problem, joint swelling, neck pain and neck stiffness.  Skin: Negative for color change, pallor, rash and wound.  Neurological: Negative for seizures, syncope, weakness and numbness.  Physical Exam Updated Vital Signs BP 126/83 (BP Location: Right Arm)   Pulse 92   Temp 98.8 F (37.1 C) (Oral)   Resp 17   Ht 5\' 11"  (1.803 m)   Wt 103.9 kg (229 lb)   SpO2 100%   BMI 31.94 kg/m   Physical Exam  Constitutional: He appears well-developed and well-nourished. No distress.  Afebrile, nontoxic-appearing, sitting in bed in apparent discomfort.  HENT:  Head: Normocephalic and atraumatic.  Eyes: Conjunctivae are normal.  Neck: Normal range of motion. Neck supple.  Cardiovascular: Normal  rate, regular rhythm and normal heart sounds.  No murmur heard. Pulmonary/Chest: Effort normal and breath sounds normal. No stridor. No respiratory distress. He has no wheezes. He has no rales. He exhibits no tenderness.  Abdominal: He exhibits no distension.  Musculoskeletal: Normal range of motion. He exhibits tenderness. He exhibits no edema or deformity.  Patient with tenderness of the right thoracic paraspinal muscle with tightness.  Apparent muscle spasm  Neurological: He is alert. No sensory deficit. He exhibits normal muscle tone.  Patient is ambulatory with normal stance and gait. 5/5 strength in lower extremities bilaterally. Neurovascularly intact.  Skin: Skin is warm and dry. No rash noted. He is not diaphoretic. No erythema. No pallor.  Psychiatric: He has a normal mood and affect.  Nursing note and vitals reviewed.    ED Treatments / Results  Labs (all labs ordered are listed, but only abnormal results are displayed) Labs Reviewed - No data to display  EKG  EKG Interpretation None       Radiology No results found.  Procedures Procedures (including critical care time)  Medications Ordered in ED Medications  ketorolac (TORADOL) injection 30 mg (not administered)     Initial Impression / Assessment and Plan / ED Course  I have reviewed the triage vital signs and the nursing notes.  Pertinent labs & imaging results that were available during my care of the patient were reviewed by me and considered in my medical decision making (see chart for details).    Patient presents with mid back pain with history and physical exam consistent with muscle strain.  No trauma or injury. no gross neurological deficits and normal neuro exam.  Patient has no gait abnormality or concern for cauda equina.  No loss of bowel or bladder control, fever, night sweats, weight loss, h/o malignancy, or IVDU.   RICE protocol and pain medications indicated and discussed with patient.    Will discharge home with symptomatic relief and close follow-up with PCP if symptoms persist beyond a week.  Discussed strict return precautions and advised to return to the emergency department if experiencing any new or worsening symptoms. Instructions were understood and patient agreed with discharge plan.  Final Clinical Impressions(s) / ED Diagnoses   Final diagnoses:  Muscle spasm of back  Muscle strain    ED Discharge Orders        Ordered    methocarbamol (ROBAXIN) 500 MG tablet  At bedtime PRN     07/11/17 2001    naproxen (NAPROSYN) 500 MG tablet  2 times daily     07/11/17 2001    Menthol, Topical Analgesic, (ICY HOT NATURALS) 7.5 % CREA  2 times daily PRN     07/11/17 2001       Gregary CromerMitchell,  B, PA-C 07/11/17 2011    Donnetta Hutchingook, Brian, MD 07/11/17 2358

## 2017-07-11 NOTE — ED Notes (Signed)
Pt alert & oriented x4, stable gait. Patient given discharge instructions, paperwork & prescription(s). Patient  instructed to stop at the registration desk to finish any additional paperwork. Patient verbalized understanding. Pt left department w/ no further questions. 

## 2017-07-11 NOTE — Discharge Instructions (Signed)
As discussed, take naproxen twice a day with food and muscle relaxer at nighttime to help with muscle spasm. Not operate machinery, drive or drink alcohol while taking this medication.  You may use warm compresses, massage and muscle creams to help as well.  Follow-up with a primary care provider if symptoms persist beyond a week.   Return if symptoms worsen, loss of bowel or bladder function, weakness or numbness in your lower extremities or other new concerning symptoms in the meantime.

## 2018-03-20 ENCOUNTER — Emergency Department (HOSPITAL_COMMUNITY): Payer: Self-pay

## 2018-03-20 ENCOUNTER — Emergency Department (HOSPITAL_COMMUNITY)
Admission: EM | Admit: 2018-03-20 | Discharge: 2018-03-20 | Disposition: A | Discharge status: Home / Self Care | Payer: Self-pay | Attending: Emergency Medicine | Admitting: Emergency Medicine

## 2018-03-20 ENCOUNTER — Encounter (HOSPITAL_COMMUNITY): Payer: Self-pay

## 2018-03-20 ENCOUNTER — Other Ambulatory Visit: Payer: Self-pay

## 2018-03-20 DIAGNOSIS — R109 Unspecified abdominal pain: Secondary | ICD-10-CM

## 2018-03-20 DIAGNOSIS — Z87891 Personal history of nicotine dependence: Secondary | ICD-10-CM | POA: Insufficient documentation

## 2018-03-20 DIAGNOSIS — R1032 Left lower quadrant pain: Secondary | ICD-10-CM | POA: Insufficient documentation

## 2018-03-20 LAB — CBC
HEMATOCRIT: 47.2 % (ref 39.0–52.0)
HEMOGLOBIN: 15.7 g/dL (ref 13.0–17.0)
MCH: 27.4 pg (ref 26.0–34.0)
MCHC: 33.3 g/dL (ref 30.0–36.0)
MCV: 82.4 fL (ref 80.0–100.0)
Platelets: 259 10*3/uL (ref 150–400)
RBC: 5.73 MIL/uL (ref 4.22–5.81)
RDW: 12.5 % (ref 11.5–15.5)
WBC: 7.4 10*3/uL (ref 4.0–10.5)
nRBC: 0 % (ref 0.0–0.2)

## 2018-03-20 LAB — COMPREHENSIVE METABOLIC PANEL
ALBUMIN: 4.4 g/dL (ref 3.5–5.0)
ALT: 17 U/L (ref 0–44)
AST: 20 U/L (ref 15–41)
Alkaline Phosphatase: 78 U/L (ref 38–126)
Anion gap: 6 (ref 5–15)
BUN: 12 mg/dL (ref 6–20)
CHLORIDE: 107 mmol/L (ref 98–111)
CO2: 27 mmol/L (ref 22–32)
Calcium: 9 mg/dL (ref 8.9–10.3)
Creatinine, Ser: 0.99 mg/dL (ref 0.61–1.24)
GFR calc Af Amer: >60 mL/min (ref >60)
GFR calc non Af Amer: >60 mL/min (ref >60)
Glucose, Bld: 82 mg/dL (ref 70–99)
POTASSIUM: 3.8 mmol/L (ref 3.5–5.1)
SODIUM: 140 mmol/L (ref 135–145)
Total Bilirubin: 0.5 mg/dL (ref 0.3–1.2)
Total Protein: 7.1 g/dL (ref 6.5–8.1)

## 2018-03-20 LAB — URINALYSIS, ROUTINE W REFLEX MICROSCOPIC
Bilirubin Urine: NEGATIVE
Glucose, UA: NEGATIVE mg/dL
Hgb urine dipstick: NEGATIVE
Ketones, ur: NEGATIVE mg/dL
Leukocytes, UA: NEGATIVE
NITRITE: NEGATIVE
Protein, ur: NEGATIVE mg/dL
SPECIFIC GRAVITY, URINE: 1.016 (ref 1.005–1.030)
pH: 5 (ref 5.0–8.0)

## 2018-03-20 LAB — LIPASE, BLOOD: LIPASE: 27 U/L (ref 11–51)

## 2018-03-20 MED ORDER — DICYCLOMINE HCL 20 MG PO TABS: 20.0000 mg | ORAL_TABLET | Freq: Four times a day (QID) | ORAL | 0 refills | Status: AC | PRN

## 2018-03-20 MED ORDER — IOPAMIDOL (ISOVUE-300) INJECTION 61%
100.0000 mL | Freq: Once | INTRAVENOUS | Status: AC | PRN
  Administered 2018-03-20: 100 mL via INTRAVENOUS

## 2018-03-20 MED ORDER — FAMOTIDINE 20 MG PO TABS: 20.0000 mg | ORAL_TABLET | Freq: Every day | ORAL | 0 refills | Status: AC

## 2018-03-20 MED ORDER — DICYCLOMINE HCL 10 MG/ML IM SOLN
20.0000 mg | Freq: Once | INTRAMUSCULAR | Status: AC
  Administered 2018-03-20: 20 mg via INTRAMUSCULAR
  Filled 2018-03-20: qty 2

## 2018-03-20 NOTE — ED Provider Notes (Signed)
[facility] EMERGENCY DEPARTMENT Provider Note   CSN: [id number] Arrival date & time: 03/20/18  1106     History   Chief Complaint Chief Complaint  Patient presents with  . Abdominal Pain    HPI Joseph Hardy is a 32 y.o. male.  HPI Pt was seen at 1440.  Per pt, c/o gradual onset and persistence of multiple intermittent episodes of left sided abd "pain" for the past 1 month. Pt states yesterday the pain "came on and stayed for 6 hours" before resolving. Has been associated with multiple intermittent episodes of diarrhea and N/V today.  Describes the abd pain as "cramping" with occasional radiation into his left lower back.  Denies fevers, no back pain, no rash, no CP/SOB, no black or blood in stools or emesis, no dysuria/hematuria, no testicular pain/swelling.       History reviewed. No pertinent past medical history.  There are no active problems to display for this patient.   Past Surgical History:  Procedure Laterality Date  . TONSILLECTOMY          Home Medications    Prior to Admission medications   Medication Sig Start Date End Date Taking? Authorizing Provider  Menthol, Topical Analgesic, (BIOFREEZE EX) Apply 1 application topically daily as needed (for pain).   Yes [provider]    Family History No family history on file.  Social History Social History   Tobacco Use  . Smoking status: Former Smoker    Types: Cigarettes    Last attempt to quit: 05/16/2017    Years since quitting: 0.8  . Smokeless tobacco: Never Used  Substance Use Topics  . Alcohol use: No  . Drug use: No     Allergies   Patient has no known allergies.   Review of Systems Review of Systems ROS: Statement: All systems negative except as marked or noted in the HPI; Constitutional: Negative for fever and chills. ; ; Eyes: Negative for eye pain, redness and discharge. ; ; ENMT: Negative for ear pain, hoarseness, nasal congestion, sinus pressure and sore throat.  ; ; Cardiovascular: Negative for chest pain, palpitations, diaphoresis, dyspnea and peripheral edema. ; ; Respiratory: Negative for cough, wheezing and stridor. ; ; Gastrointestinal: +N/V/D, abd pain. Negative for blood in stool, hematemesis, jaundice and rectal bleeding. . ; ; Genitourinary: Negative for dysuria, flank pain and hematuria. ; ; Genital:  No penile drainage or rash, no testicular pain or swelling, no scrotal rash or swelling. ;;; Musculoskeletal: Negative for back pain and neck pain. Negative for swelling and trauma.; ; Skin: Negative for pruritus, rash, abrasions, blisters, bruising and skin lesion.; ; Neuro: Negative for headache, lightheadedness and neck stiffness. Negative for weakness, altered level of consciousness, altered mental status, extremity weakness, paresthesias, involuntary movement, seizure and syncope.       Physical Exam Updated Vital Signs BP 109/81   Pulse 70   Temp 98 F (36.7 C) (Oral)   Resp 16   Ht 5\' 11"  (1.803 m)   Wt 98.9 kg   SpO2 99%   BMI 30.40 kg/m   Physical Exam 1445: Physical examination:  Nursing notes reviewed; Vital signs and O2 SAT reviewed;  Constitutional: Well developed, Well nourished, Well hydrated, In no acute distress; Head:  Normocephalic, atraumatic; Eyes: EOMI, PERRL, No scleral icterus; ENMT: Mouth and pharynx normal, Mucous membranes moist; Neck: Supple, Full range of motion, No lymphadenopathy; Cardiovascular: Regular rate and rhythm, No gallop; Respiratory: Breath sounds clear & equal bilaterally, No wheezes.  Speaking full sentences with ease, Normal respiratory effort/excursion; Chest: Nontender, Movement normal; Abdomen: Soft, +mild left sided abd tenderness to palp. No rebound or guarding. Nondistended, Normal bowel sounds; Genitourinary: No CVA tenderness; Extremities: Peripheral pulses normal, No tenderness, No edema, No calf edema or asymmetry.; Neuro: AA&Ox3, Major CN grossly intact.  Speech clear. No gross focal motor or  sensory deficits in extremities.; Skin: Color normal, Warm, Dry.   ED Treatments / Results  Labs (all labs ordered are listed, but only abnormal results are displayed)   EKG None  Radiology   Procedures Procedures (including critical care time)  Medications Ordered in ED Medications  dicyclomine (BENTYL) injection 20 mg (20 mg Intramuscular Given 03/20/18 1516)  iopamidol (ISOVUE-300) 61 % injection 100 mL (100 mLs Intravenous Contrast Given 03/20/18 1522)     Initial Impression / Assessment and Plan / ED Course  I have reviewed the triage vital signs and the nursing notes.  Pertinent labs & imaging results that were available during my care of the patient were reviewed by me and considered in my medical decision making (see chart for details).  MDM Reviewed: previous chart, nursing note and vitals Reviewed previous: labs Interpretation: labs, CT scan and x-ray   Results for orders placed or performed during the hospital encounter of 03/20/18  Lipase, blood  Result Value Ref Range   Lipase 27 11 - 51 U/L  Comprehensive metabolic panel  Result Value Ref Range   Sodium 140 135 - 145 mmol/L   Potassium 3.8 3.5 - 5.1 mmol/L   Chloride 107 98 - 111 mmol/L   CO2 27 22 - 32 mmol/L   Glucose, Bld 82 70 - 99 mg/dL   BUN 12 6 - 20 mg/dL   Creatinine, Ser 1.61 0.61 - 1.24 mg/dL   Calcium 9.0 8.9 - 09.6 mg/dL   Total Protein 7.1 6.5 - 8.1 g/dL   Albumin 4.4 3.5 - 5.0 g/dL   AST 20 15 - 41 U/L   ALT 17 0 - 44 U/L   Alkaline Phosphatase 78 38 - 126 U/L   Total Bilirubin 0.5 0.3 - 1.2 mg/dL   GFR calc non Af Amer >60 >60 mL/min   GFR calc Af Amer >60 >60 mL/min   Anion gap 6 5 - 15  CBC  Result Value Ref Range   WBC 7.4 4.0 - 10.5 K/uL   RBC 5.73 4.22 - 5.81 MIL/uL   Hemoglobin 15.7 13.0 - 17.0 g/dL   HCT 04.5 40.9 - 81.1 %   MCV 82.4 80.0 - 100.0 fL   MCH 27.4 26.0 - 34.0 pg   MCHC 33.3 30.0 - 36.0 g/dL   RDW 91.4 78.2 - 95.6 %   Platelets 259 150 - 400 K/uL    nRBC 0.0 0.0 - 0.2 %  Urinalysis, Routine w reflex microscopic  Result Value Ref Range   Color, Urine YELLOW YELLOW   APPearance CLEAR CLEAR   Specific Gravity, Urine 1.016 1.005 - 1.030   pH 5.0 5.0 - 8.0   Glucose, UA NEGATIVE NEGATIVE mg/dL   Hgb urine dipstick NEGATIVE NEGATIVE   Bilirubin Urine NEGATIVE NEGATIVE   Ketones, ur NEGATIVE NEGATIVE mg/dL   Protein, ur NEGATIVE NEGATIVE mg/dL   Nitrite NEGATIVE NEGATIVE   Leukocytes, UA NEGATIVE NEGATIVE   Dg Chest 2 View Result Date: 03/20/2018 CLINICAL DATA:  Diarrhea, abdominal pain vomiting. EXAM: CHEST - 2 VIEW COMPARISON:  Chest radiograph May 30, 2017 FINDINGS: Cardiomediastinal silhouette is normal. No pleural effusions or  focal consolidations. Similar RIGHT middle lobe atelectasis. Trachea projects midline and there is no pneumothorax. Soft tissue planes and included osseous structures are non-suspicious. IMPRESSION: No active cardiopulmonary process. Electronically Signed   By: Awilda Metro M.D.   On: 03/20/2018 15:49   Ct Abdomen Pelvis W Contrast Result Date: 03/20/2018 CLINICAL DATA:  Left abdomen pain for 6 months, worsened since yesterday with nausea and vomiting. EXAM: CT ABDOMEN AND PELVIS WITH CONTRAST TECHNIQUE: Multidetector CT imaging of the abdomen and pelvis was performed using the standard protocol following bolus administration of intravenous contrast. CONTRAST:  ISOVUE-300 IOPAMIDOL (ISOVUE-300) INJECTION 61% COMPARISON:  None. FINDINGS: Lower chest: Minimal atelectasis of posterior lung bases are identified. The heart size is normal. Hepatobiliary: No focal liver abnormality is seen. No gallstones, gallbladder wall thickening, or biliary dilatation. Pancreas: Unremarkable. No pancreatic ductal dilatation or surrounding inflammatory changes. Spleen: Normal in size without focal abnormality. Adrenals/Urinary Tract: Adrenal glands are unremarkable. Kidneys are normal, without renal calculi, focal lesion, or  hydronephrosis. Bladder is unremarkable. Stomach/Bowel: There is a small hiatal hernia. Stomach is otherwise within normal limits. Appendix appears normal. No evidence of bowel wall thickening, distention, or inflammatory changes. Vascular/Lymphatic: No significant vascular findings are present. No enlarged abdominal or pelvic lymph nodes. Reproductive: Prostate is unremarkable. Other: No abdominal wall hernia or abnormality. No abdominopelvic ascites. Musculoskeletal: No acute or significant osseous findings. IMPRESSION: No acute abnormality identified in the abdomen and pelvis. There is no bowel obstruction. The appendix is normal. Small hiatal hernia. Electronically Signed   By: Sherian Rein M.D.   On: 03/20/2018 15:50    1600:  Workup reassuring. Tx symptomatically, f/u GI MD. Dx and testing d/w pt and family.  Questions answered.  Verb understanding, agreeable to d/c home with outpt f/u.   Final Clinical Impressions(s) / ED Diagnoses   Final diagnoses:  None    ED Discharge Orders    None       Samuel Jester, DO 03/22/18 2141

## 2018-03-20 NOTE — ED Triage Notes (Signed)
For a month pt has been having left sided pain. Yesterday he states pain lasted 6 hours. Had diarrhea and also threw up this morning. NAD. Denies urinary symptoms

## 2018-03-20 NOTE — Discharge Instructions (Addendum)
Eat a bland diet, avoiding greasy, fatty, fried foods, as well as spicy and acidic foods or beverages.  Avoid eating within 2 to 3 hours before going to bed or laying down.  Also avoid teas, colas, coffee, chocolate, pepermint and spearment. Take the prescriptions as directed.  Call your regular medical doctor tomorrow to schedule a follow up appointment this week. Call the GI doctor tomorrow to schedule a follow up appointment within the next week.  Return to the Emergency Department immediately if worsening.

## 2018-03-20 NOTE — ED Notes (Signed)
Patient transported to CT 

## 2019-04-03 ENCOUNTER — Other Ambulatory Visit: Payer: Self-pay

## 2019-04-03 DIAGNOSIS — Z20822 Contact with and (suspected) exposure to covid-19: Secondary | ICD-10-CM

## 2019-04-05 ENCOUNTER — Telehealth: Payer: Self-pay | Admitting: *Deleted

## 2019-04-05 LAB — NOVEL CORONAVIRUS, NAA: SARS-CoV-2, NAA: DETECTED — AB

## 2019-04-05 NOTE — Telephone Encounter (Signed)
Pt called in requesting COVID-19 test result.   I let him know his was detected/positive meaning he had the virus.   I went over the 10 day quarantine protocol.   His symptoms began on 04/02/2019 (fever, chills, and body aches).   I let him know he needed to quarantine until 04/11/2019 and if his symptoms are resolving and he is fever free for 3 straight days without using Tylenol to keep the fever down he could end the quarantine. I went over the 14 day quarantine for the rest of his family. I went over the cleaning, wearing a mask, hand washing, socia distancing even from family if possible.    He verbalized understanding.   I answered his questions.    I also let him know to go to the ED if he developed chest pressure/pain or shortness of breath.

## 2019-04-05 NOTE — Telephone Encounter (Signed)
Pt called in requesting that covid results be mailed. Please advise.

## 2019-04-17 ENCOUNTER — Other Ambulatory Visit: Payer: Self-pay

## 2019-04-17 DIAGNOSIS — Z20822 Contact with and (suspected) exposure to covid-19: Secondary | ICD-10-CM

## 2019-04-19 LAB — NOVEL CORONAVIRUS, NAA: SARS-CoV-2, NAA: NOT DETECTED

## 2019-09-03 ENCOUNTER — Ambulatory Visit: Payer: Self-pay | Attending: Internal Medicine

## 2019-09-03 ENCOUNTER — Other Ambulatory Visit: Payer: Self-pay

## 2019-09-03 DIAGNOSIS — Z20822 Contact with and (suspected) exposure to covid-19: Secondary | ICD-10-CM | POA: Insufficient documentation

## 2019-09-04 LAB — SARS-COV-2, NAA 2 DAY TAT

## 2019-09-04 LAB — NOVEL CORONAVIRUS, NAA: SARS-CoV-2, NAA: NOT DETECTED

## 2020-02-13 IMAGING — DX DG CHEST 2V
2 series · 2 of 2 positions shown · non-contrast
Comparison: Chest radiograph May 30, 2017

CLINICAL DATA: Diarrhea, abdominal pain vomiting.

EXAM:
CHEST - 2 VIEW

[chest pa]
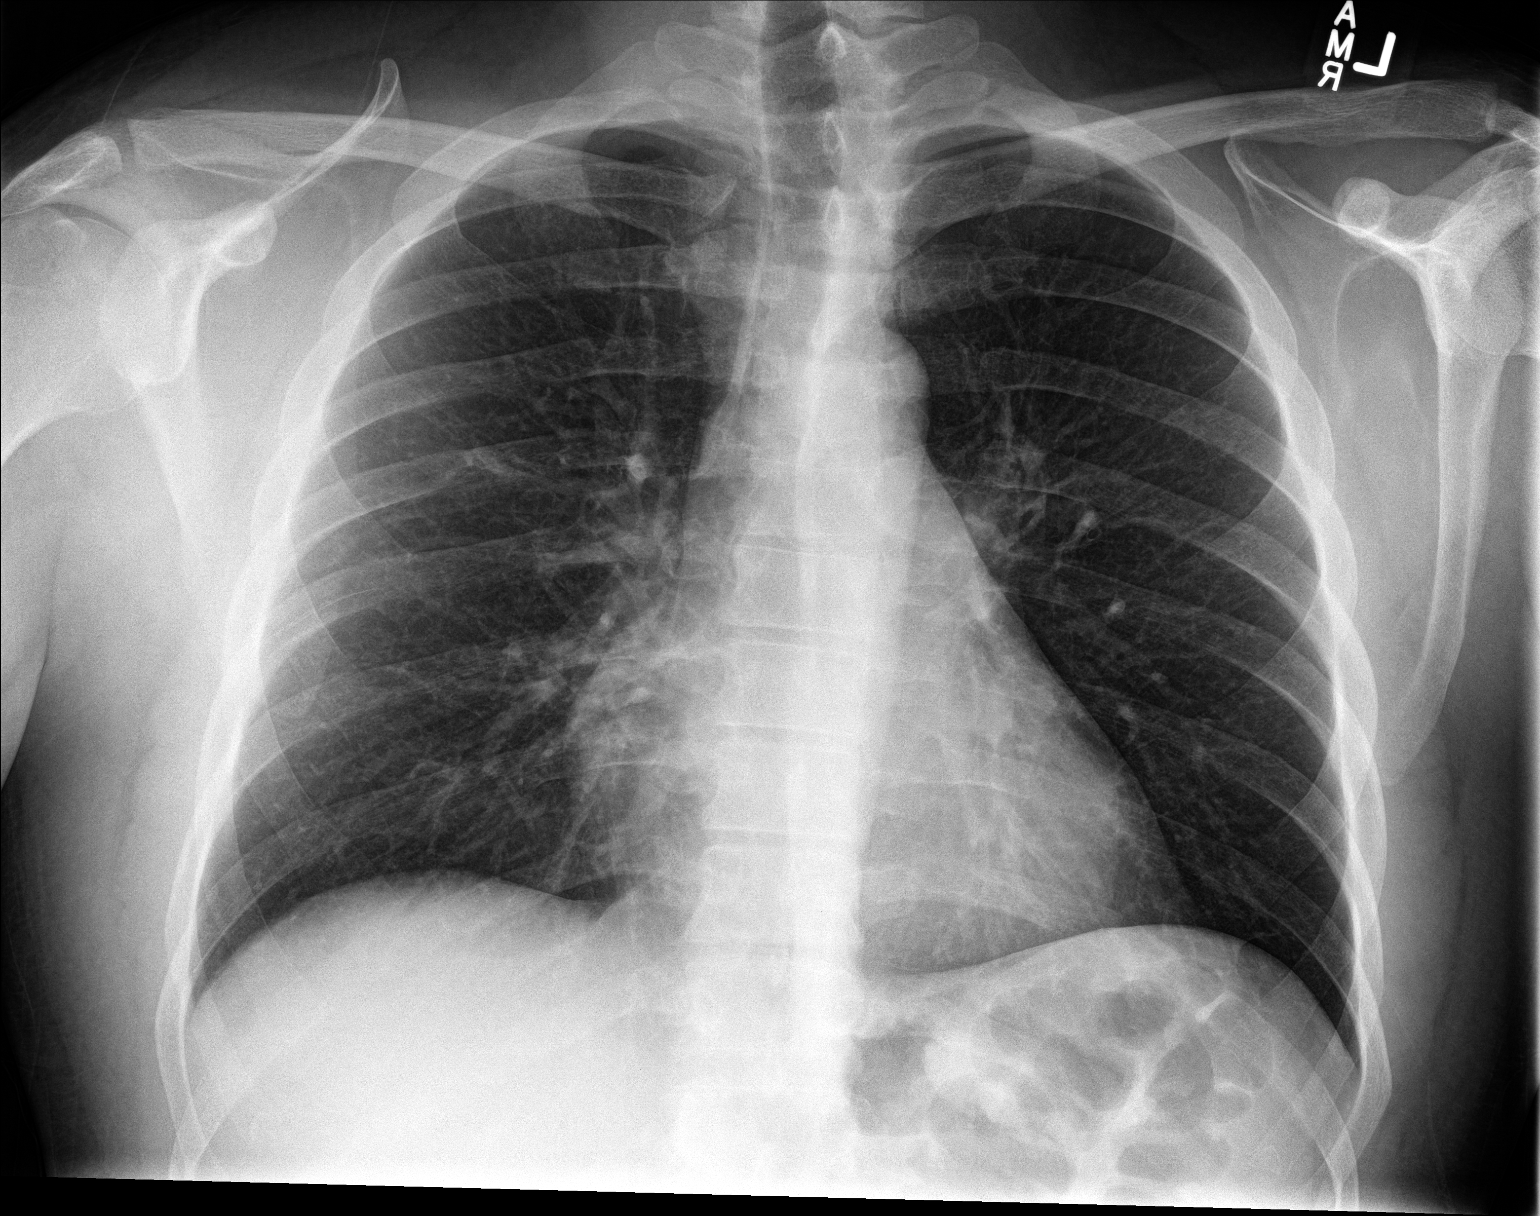

[chest lat]
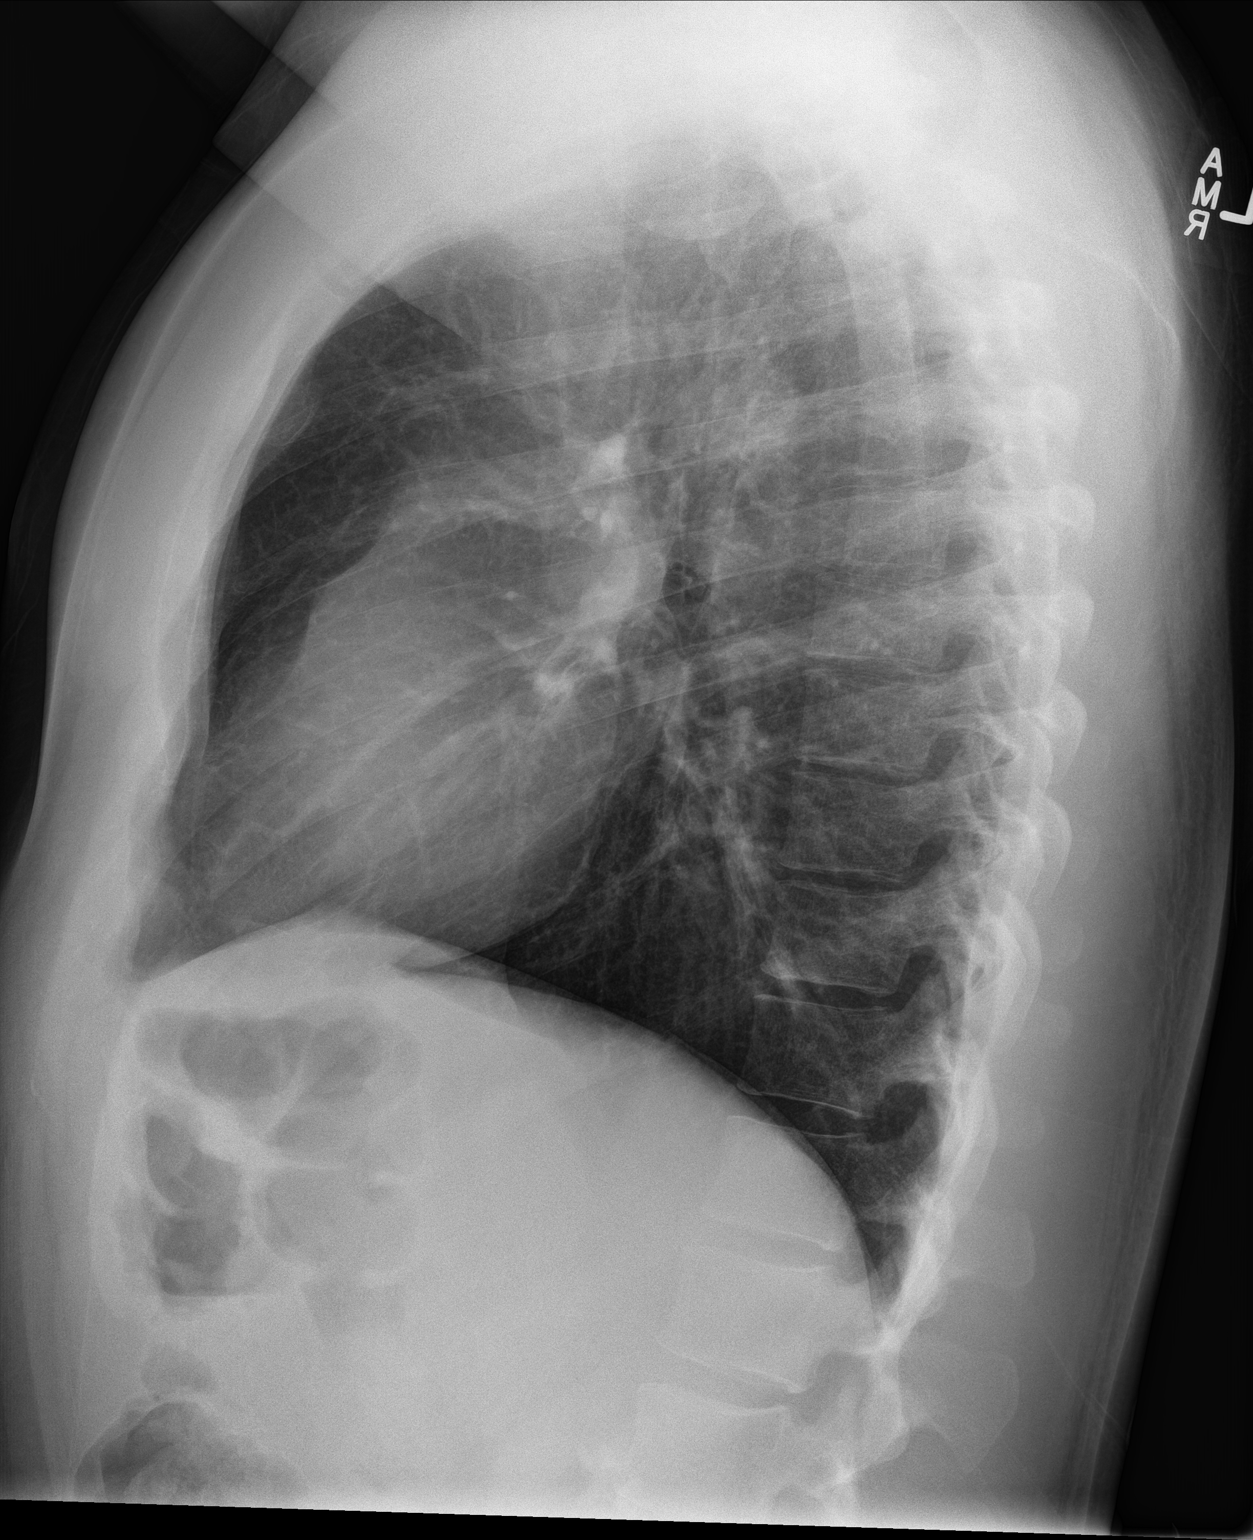

[2 of 2 positions shown; findings below may reference images not displayed]

FINDINGS: Cardiomediastinal silhouette is normal. No pleural effusions or
focal consolidations. Similar RIGHT middle lobe atelectasis. Trachea
projects midline and there is no pneumothorax. Soft tissue planes
and included osseous structures are non-suspicious.
IMPRESSION: No active cardiopulmonary process.

## 2020-02-13 IMAGING — CT CT ABD-PELV W/ CM
2 of 4 series · 16 of 46 positions shown, 18 images · IV contrast (iopamidol)
Comparison: None.

CLINICAL DATA: Left abdomen pain for 6 months, worsened since
yesterday with nausea and vomiting.

EXAM:
CT ABDOMEN AND PELVIS WITH CONTRAST
TECHNIQUE: Multidetector CT imaging of the abdomen and pelvis was performed
using the standard protocol following bolus administration of
intravenous contrast.
CONTRAST:  100mL JCL2XY-8FF IOPAMIDOL (JCL2XY-8FF) INJECTION 61%

[Series 2: axial st · axial · 0.74mm/px · z∈[+727,+1187]mm · 13 of 101 slices shown, 15 images]
[im 5/101  soft-tissue]
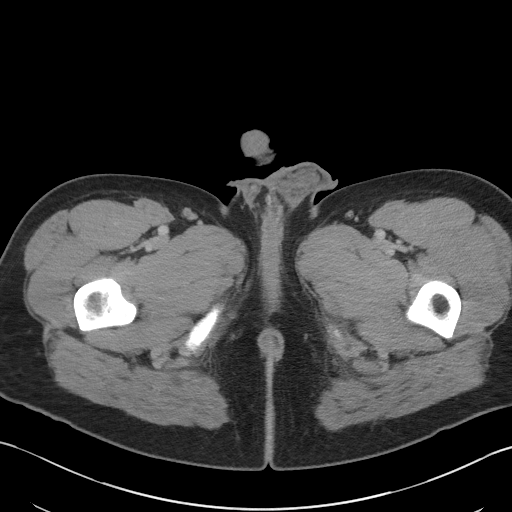
[im 5/101  bone]
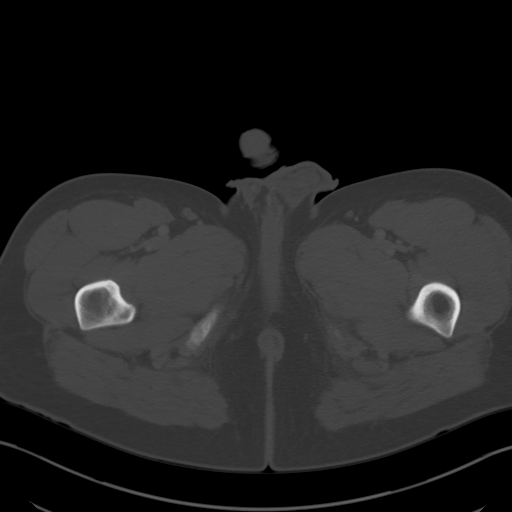
[im 13/101  soft-tissue]
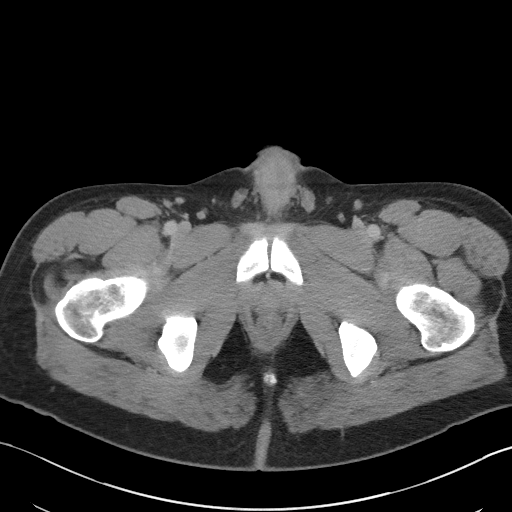
[im 21/101  soft-tissue]
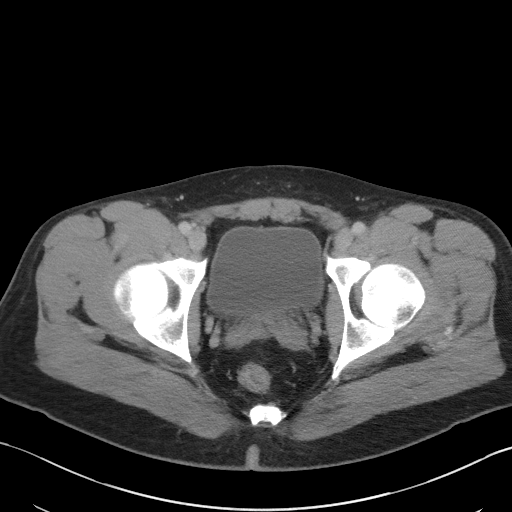
[im 29/101  soft-tissue]
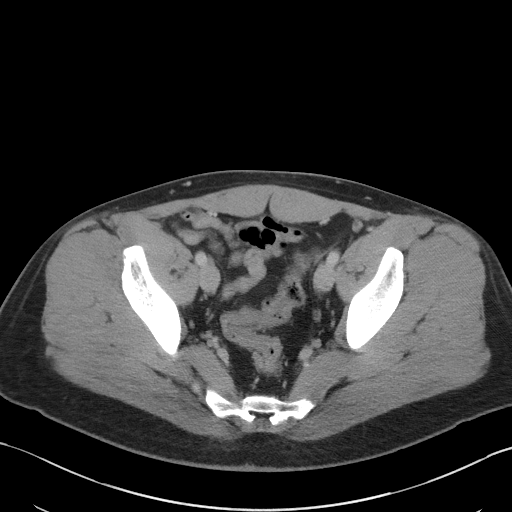
[im 37/101  soft-tissue]
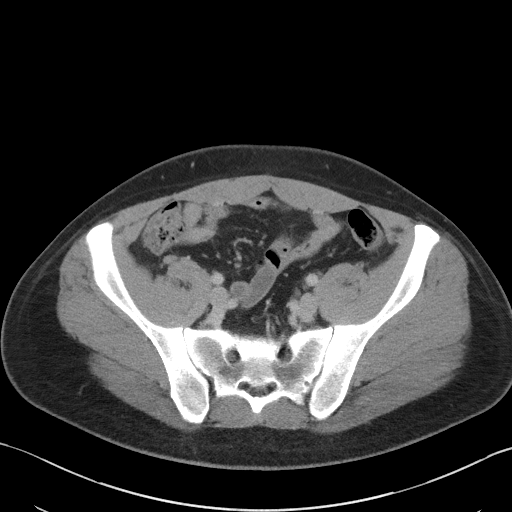
[im 45/101  soft-tissue]
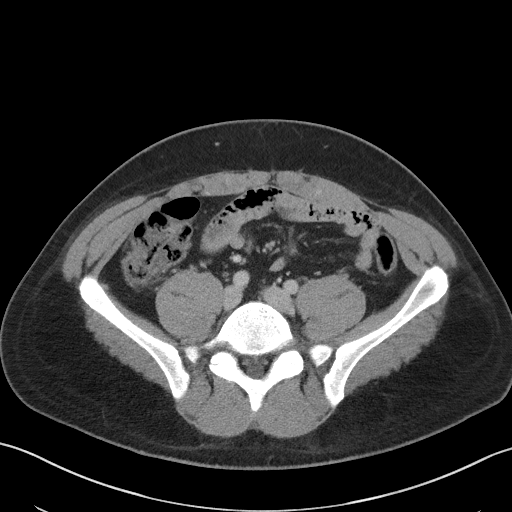
[im 53/101  soft-tissue]
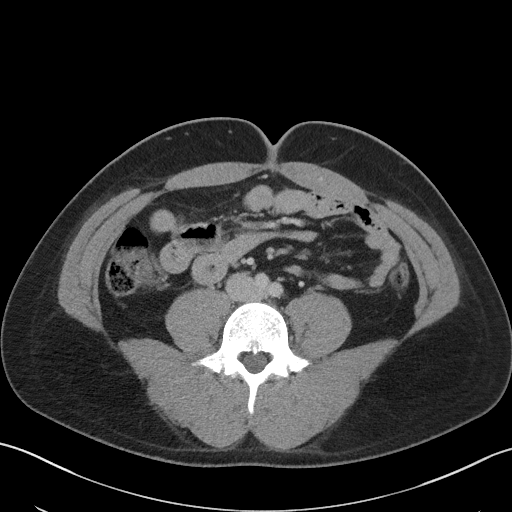
[im 57/101  soft-tissue]
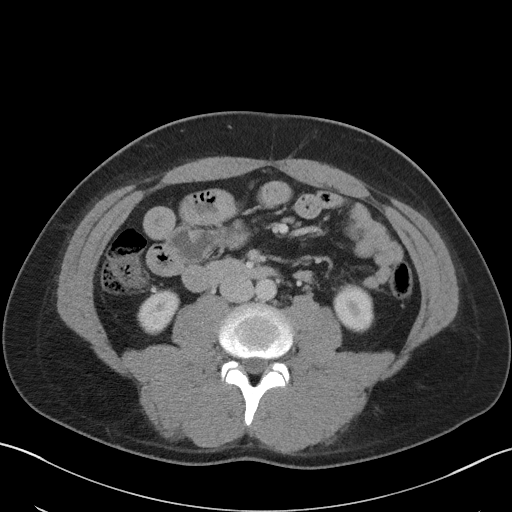
[im 65/101  soft-tissue]
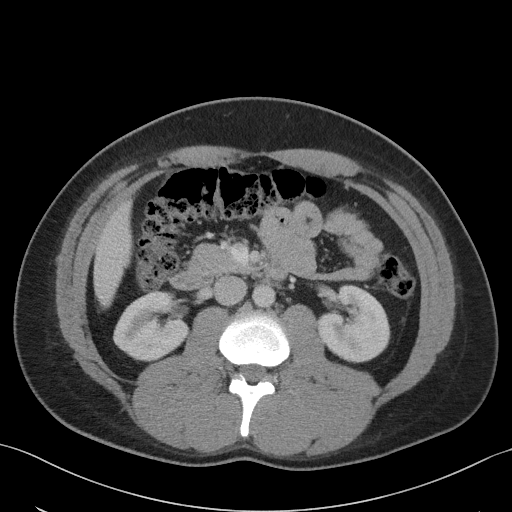
[im 65/101  bone]
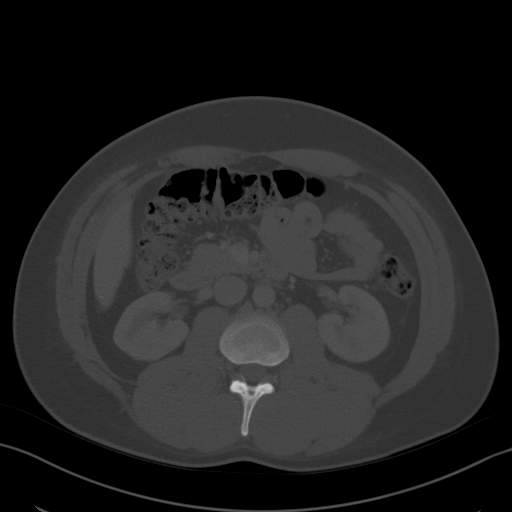
[im 73/101  soft-tissue]
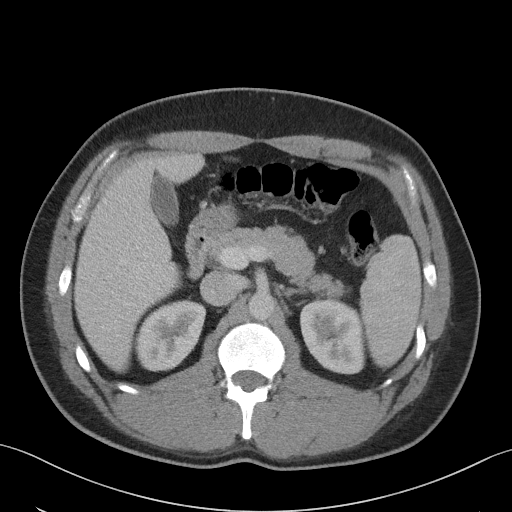
[im 81/101  soft-tissue]
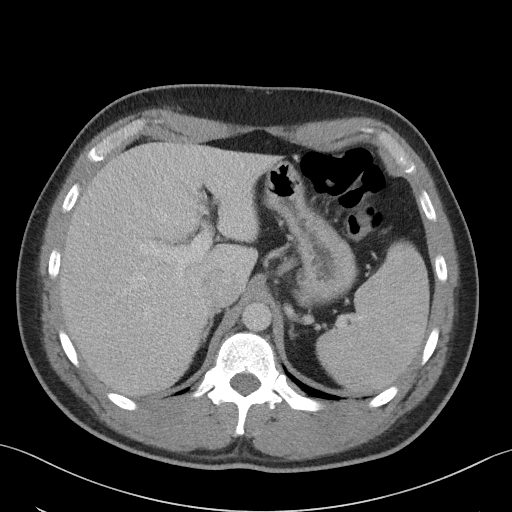
[im 89/101  soft-tissue]
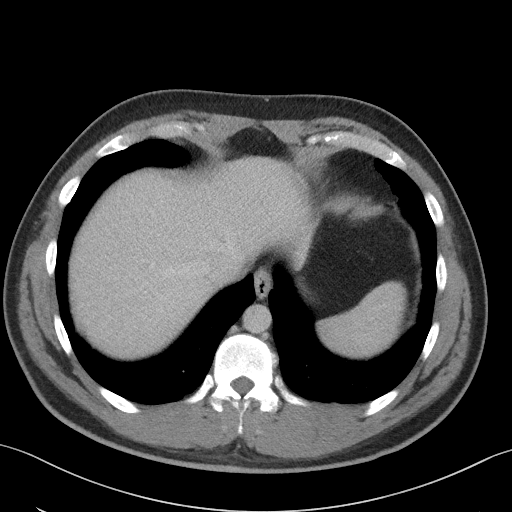
[im 97/101  soft-tissue]
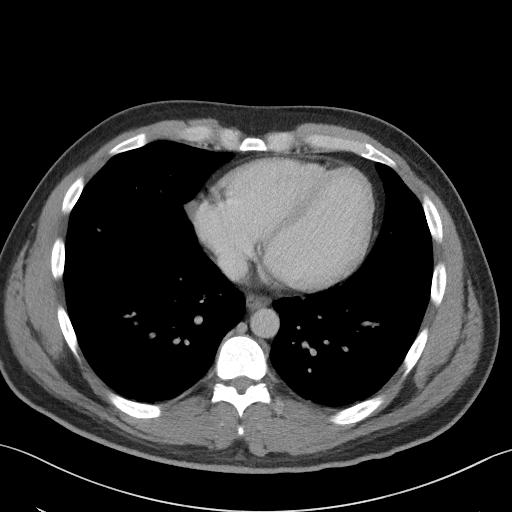

[Series 5: coronal st · coronal · 0.75mm/px · 3 of 98 slices shown]
[im 33/98  soft-tissue]
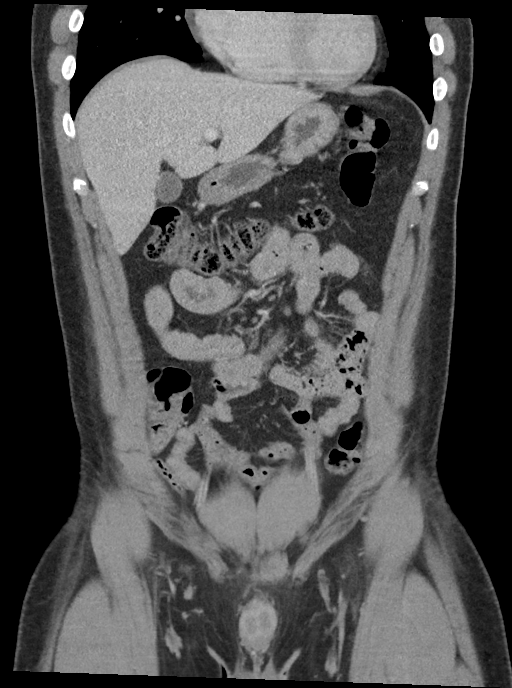
[im 44/98  soft-tissue]
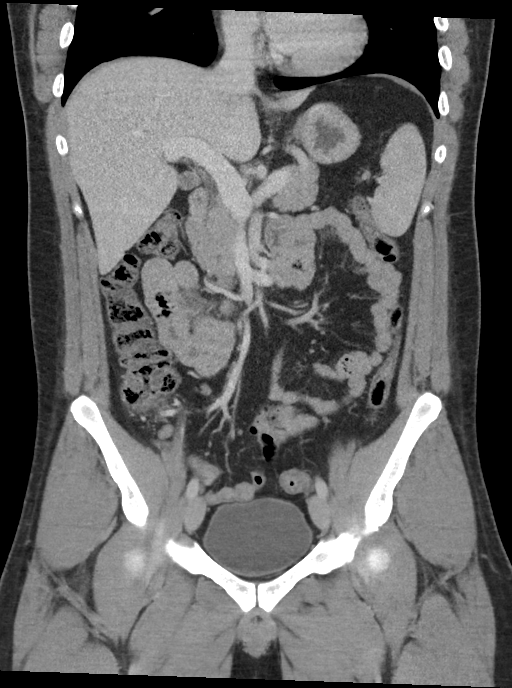
[im 54/98  soft-tissue]
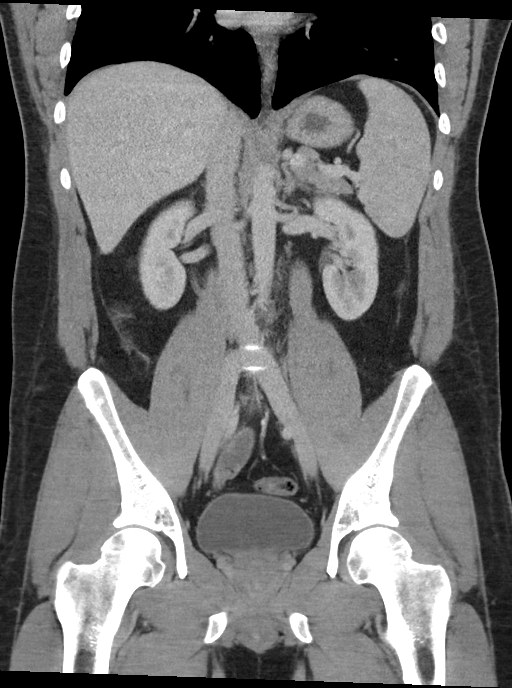

[16 of 46 positions shown; findings below may reference images not displayed]

FINDINGS: Lower chest: Minimal atelectasis of posterior lung bases are
identified. The heart size is normal.

Hepatobiliary: No focal liver abnormality is seen. No gallstones,
gallbladder wall thickening, or biliary dilatation.

Pancreas: Unremarkable. No pancreatic ductal dilatation or
surrounding inflammatory changes.

Spleen: Normal in size without focal abnormality.

Adrenals/Urinary Tract: Adrenal glands are unremarkable. Kidneys are
normal, without renal calculi, focal lesion, or hydronephrosis.
Bladder is unremarkable.

Stomach/Bowel: There is a small hiatal hernia. Stomach is otherwise
within normal limits. Appendix appears normal. No evidence of bowel
wall thickening, distention, or inflammatory changes.

Vascular/Lymphatic: No significant vascular findings are present. No
enlarged abdominal or pelvic lymph nodes.

Reproductive: Prostate is unremarkable.

Other: No abdominal wall hernia or abnormality. No abdominopelvic
ascites.

Musculoskeletal: No acute or significant osseous findings.
IMPRESSION: No acute abnormality identified in the abdomen and pelvis. There is
no bowel obstruction. The appendix is normal.

Small hiatal hernia.

## 2020-07-11 ENCOUNTER — Encounter (HOSPITAL_COMMUNITY): Payer: Self-pay

## 2020-07-11 ENCOUNTER — Other Ambulatory Visit: Payer: Self-pay

## 2020-07-11 ENCOUNTER — Emergency Department (HOSPITAL_COMMUNITY)
Admission: EM | Admit: 2020-07-11 | Discharge: 2020-07-11 | Disposition: A | Discharge status: Home / Self Care | Payer: Self-pay | Attending: Emergency Medicine | Admitting: Emergency Medicine

## 2020-07-11 DIAGNOSIS — S61011A Laceration without foreign body of right thumb without damage to nail, initial encounter: Secondary | ICD-10-CM | POA: Insufficient documentation

## 2020-07-11 DIAGNOSIS — Z87891 Personal history of nicotine dependence: Secondary | ICD-10-CM | POA: Insufficient documentation

## 2020-07-11 DIAGNOSIS — Z23 Encounter for immunization: Secondary | ICD-10-CM | POA: Insufficient documentation

## 2020-07-11 DIAGNOSIS — W260XXA Contact with knife, initial encounter: Secondary | ICD-10-CM | POA: Insufficient documentation

## 2020-07-11 MED ORDER — TETANUS-DIPHTH-ACELL PERTUSSIS 5-2.5-18.5 LF-MCG/0.5 IM SUSY
0.5000 mL | PREFILLED_SYRINGE | Freq: Once | INTRAMUSCULAR | Status: AC
  Administered 2020-07-11: 0.5 mL via INTRAMUSCULAR
  Filled 2020-07-11: qty 0.5

## 2020-07-11 NOTE — ED Notes (Signed)
Suture Cart at bedside.  

## 2020-07-11 NOTE — ED Notes (Signed)
ED Provider at bedside. 

## 2020-07-11 NOTE — ED Triage Notes (Signed)
Pt presents to ED with laceration to right thumb. Pt reports opening bottle and knife slipped cutting his thumb from top to bottom per pt. Thumb is wrapped in compression dressing at this time and bleeding is controlled.

## 2020-07-11 NOTE — ED Provider Notes (Signed)
[facility] EMERGENCY DEPARTMENT Provider Note   CSN: [id number] Arrival date & time: 07/11/20  0013     History Chief Complaint  Patient presents with  . Laceration    Right thumb    Joseph Hardy is a 35 y.o. male.  The history is provided by the patient.  Laceration Location:  Finger Finger laceration location:  R thumb Length:  4cm Laceration mechanism:  Knife Pain details:    Quality:  Aching   Severity:  Moderate   Timing:  Constant Relieved by:  None tried Worsened by:  Nothing Tetanus status:  Unknown Associated symptoms: fever   Associated symptoms: no numbness   Patient was opening a bottle with a knife when he accidentally cut the volar aspect of the right thumb.  No other acute complaint      PMH-none Past Surgical History:  Procedure Laterality Date  . TONSILLECTOMY         No family history on file.  Social History   Tobacco Use  . Smoking status: Former Smoker    Types: Cigarettes    Quit date: 05/16/2017    Years since quitting: 3.1  . Smokeless tobacco: Never Used  Substance Use Topics  . Alcohol use: No  . Drug use: No    Home Medications Prior to Admission medications   Medication Sig Start Date End Date Taking? Authorizing Provider  dicyclomine (BENTYL) 20 MG tablet Take 1 tablet (20 mg total) by mouth every 6 (six) hours as needed for spasms (abdominal cramping). 03/20/18   Samuel Jester, DO  famotidine (PEPCID) 20 MG tablet Take 1 tablet (20 mg total) by mouth daily. 03/20/18   Samuel Jester, DO  Menthol, Topical Analgesic, (BIOFREEZE EX) Apply 1 application topically daily as needed (for pain).    [provider]    Allergies    Patient has no known allergies.  Review of Systems   Review of Systems  Constitutional: Positive for fever.  Skin: Positive for wound.  Neurological: Negative for weakness and numbness.    Physical Exam Updated Vital Signs BP 121/84 (BP Location: Right Arm)   Pulse  70   Temp 98 F (36.7 C) (Oral)   Resp 17   Ht 1.803 m (5\' 11" )   Wt 98.4 kg   SpO2 97%   BMI 30.27 kg/m   Physical Exam CONSTITUTIONAL: Well developed/well nourished HEAD: Normocephalic/atraumatic EYES: EOMI ENMT: Mucous membranes moist NECK: supple no meningeal signs LUNGS:   no apparent distress ABDOMEN: soft NEURO: Pt is awake/alert/appropriate, moves all extremitiesx4.  No facial droop.  Distal radial/media/ulnar motor/sensory intact on right hand   EXTREMITIES: pulses normal/equal, full ROM, patient has full flexion/extension, abduction and abduction of the right thumb.  There is no weakness noted SKIN: warm, color normal, large laceration noted to the right thumb.  There is no bone or tendon exposed PSYCH: no abnormalities of mood noted, alert and oriented to situation      ED Results / Procedures / Treatments   Labs (all labs ordered are listed, but only abnormal results are displayed) Labs Reviewed - No data to display  EKG None  Radiology No results found.  Procedures . Laceration Repair  Date/Time: 07/11/2020 3:50 AM Performed by: 07/13/2020, MD Authorized by: Zadie Rhine, MD   Consent:    Consent obtained:  Verbal   Consent given by:  Patient Universal protocol:    Patient identity confirmed:  Verbally with patient Anesthesia:    Anesthesia method:  Local infiltration  Local anesthetic:  Lidocaine 1% w/o epi Laceration details:    Location:  Finger   Finger location:  R thumb   Length (cm):  4 Exploration:    Wound extent: no foreign bodies/material noted, no nerve damage noted, no tendon damage noted, no underlying fracture noted and no vascular damage noted     Contaminated: no   Treatment:    Area cleansed with:  Povidone-iodine   Amount of cleaning:  Standard Skin repair:    Repair method:  Sutures   Suture size:  5-0   Suture material:  Prolene   Suture technique:  Simple interrupted   Number of sutures:   5 Approximation:    Approximation:  Close Repair type:    Repair type:  Simple Post-procedure details:    Dressing:  Splint for protection   Procedure completion:  Tolerated well, no immediate complications     Medications Ordered in ED Medications  Tdap (BOOSTRIX) injection 0.5 mL (0.5 mLs Intramuscular Given 07/11/20 0345)    ED Course  I have reviewed the triage vital signs and the nursing notes.      MDM Rules/Calculators/A&P                          Patient with accidental laceration of the right thumb.  I fully explored the thumb and there is no foreign bodies, no tendon injury.  He has full flexion-extension of the thumb.  Patient tolerated repair well.  Will place splint for comfort. Final Clinical Impression(s) / ED Diagnoses Final diagnoses:  Laceration of right thumb without foreign body without damage to nail, initial encounter    Rx / DC Orders ED Discharge Orders    None       Zadie Rhine, MD 07/11/20 0410

## 2023-02-15 ENCOUNTER — Emergency Department (HOSPITAL_COMMUNITY)
Admission: EM | Admit: 2023-02-15 | Discharge: 2023-02-15 | Disposition: A | Discharge status: Home / Self Care | Payer: Self-pay | Attending: Emergency Medicine | Admitting: Emergency Medicine

## 2023-02-15 ENCOUNTER — Encounter (HOSPITAL_COMMUNITY): Payer: Self-pay | Admitting: Emergency Medicine

## 2023-02-15 ENCOUNTER — Other Ambulatory Visit: Payer: Self-pay

## 2023-02-15 DIAGNOSIS — L237 Allergic contact dermatitis due to plants, except food: Secondary | ICD-10-CM | POA: Insufficient documentation

## 2023-02-15 MED ORDER — CLOBETASOL PROPIONATE 0.05 % EX LOTN: 1.0000 | TOPICAL_LOTION | Freq: Two times a day (BID) | CUTANEOUS | 0 refills | Status: AC

## 2023-02-15 MED ORDER — CETIRIZINE HCL 10 MG PO TABS: 10.0000 mg | ORAL_TABLET | Freq: Every day | ORAL | 0 refills | Status: AC

## 2023-02-15 MED ORDER — DEXAMETHASONE 4 MG PO TABS
8.0000 mg | ORAL_TABLET | Freq: Once | ORAL | Status: AC
  Administered 2023-02-15: 8 mg via ORAL
  Filled 2023-02-15: qty 2

## 2023-02-15 MED ORDER — PREDNISONE 20 MG PO TABS: 40.0000 mg | ORAL_TABLET | Freq: Every day | ORAL | 0 refills | Status: AC

## 2023-02-15 NOTE — Discharge Instructions (Signed)
It was a pleasure caring for you today in the emergency department.  Please return to the emergency department for any worsening or worrisome symptoms.  Be sure to clean anything clothing or items that you used at the time of exposure to avoid re-exposure.   Consider use of over the counter poison ivy remedies, calamine lotion, hydrocortisone ointment, oatmeal baths.

## 2023-02-15 NOTE — ED Provider Notes (Signed)
[place] EMERGENCY DEPARTMENT AT [facility] Provider Note  CSN: [id number] Arrival date & time: 02/15/23 4098  Chief Complaint(s) Rash  HPI Joseph Hardy is a 37 y.o. male with past medical history as below, significant for no sig med hx who presents to the ED with complaint of rash  Rash while cutting brush 2 days ago, rash noted to forearms and legs that were exposed during work. No rash to face/eyes/lips/mucus membranes. No dyspnea or dysphagia/dysphonia. Tried calamine lotion without much help. Rash appears to be worsening on forearms.   Past Medical History History reviewed. No pertinent past medical history. There are no problems to display for this patient.  Home Medication(s) Prior to Admission medications   Medication Sig Start Date End Date Taking? Authorizing Provider  cetirizine (ZYRTEC ALLERGY) 10 MG tablet Take 1 tablet (10 mg total) by mouth daily for 14 days. 02/15/23 03/01/23 Yes Tanda Rockers A, DO  Clobetasol Propionate 0.05 % lotion Apply 1 Application topically 2 (two) times daily for 7 days. Do not apply to face or genitals 02/15/23 02/22/23 Yes Tanda Rockers A, DO  predniSONE (DELTASONE) 20 MG tablet Take 2 tablets (40 mg total) by mouth daily for 5 days. 02/16/23 02/21/23 Yes Sloan Leiter, DO  dicyclomine (BENTYL) 20 MG tablet Take 1 tablet (20 mg total) by mouth every 6 (six) hours as needed for spasms (abdominal cramping). 03/20/18   Kemba Hoppes Jester, DO  famotidine (PEPCID) 20 MG tablet Take 1 tablet (20 mg total) by mouth daily. 03/20/18   Camari Wisham Jester, DO  Menthol, Topical Analgesic, (BIOFREEZE EX) Apply 1 application topically daily as needed (for pain).    [provider]                                                                                                                                    Past Surgical History Past Surgical History:  Procedure Laterality Date   TONSILLECTOMY     Family History History reviewed. No  pertinent family history.  Social History Social History   Tobacco Use   Smoking status: Former    Current packs/day: 0.00    Types: Cigarettes    Quit date: 05/16/2017    Years since quitting: 5.7   Smokeless tobacco: Never  Substance Use Topics   Alcohol use: No   Drug use: No   Allergies Patient has no known allergies.  Review of Systems Review of Systems  Constitutional:  Negative for chills and fever.  HENT:  Negative for facial swelling and trouble swallowing.   Respiratory:  Negative for shortness of breath.   Gastrointestinal:  Negative for abdominal pain, nausea and vomiting.  Genitourinary:  Negative for difficulty urinating.  Skin:  Positive for rash. Negative for wound.  All other systems reviewed and are negative.   Physical Exam Vital Signs  I have reviewed the triage vital signs BP 124/75   Pulse 72  Temp 97.7 F (36.5 C)   Resp 18   SpO2 94%  Physical Exam Vitals and nursing note reviewed.  Constitutional:      General: He is not in acute distress.    Appearance: Normal appearance. He is well-developed. He is not ill-appearing.  HENT:     Head: Normocephalic and atraumatic.     Right Ear: External ear normal.     Left Ear: External ear normal.     Nose: Nose normal.     Mouth/Throat:     Mouth: Mucous membranes are moist.     Comments: No rash around lips/nares  Eyes:     General: Lids are normal. Vision grossly intact. Gaze aligned appropriately. No scleral icterus.       Right eye: No discharge.        Left eye: No discharge.     Conjunctiva/sclera:     Right eye: Right conjunctiva is not injected.     Left eye: Left conjunctiva is not injected.  Cardiovascular:     Rate and Rhythm: Normal rate.  Pulmonary:     Effort: Pulmonary effort is normal. No respiratory distress.     Breath sounds: No stridor.  Abdominal:     General: Abdomen is flat. There is no distension.     Tenderness: There is no guarding.  Musculoskeletal:         General: No deformity.     Cervical back: No rigidity.  Skin:    General: Skin is warm and dry.     Coloration: Skin is not cyanotic, jaundiced or pale.     Findings: Rash present. Rash is crusting.     Comments: Rash to forearms/legs Rash most significant to right forearm  Neurological:     Mental Status: He is alert and oriented to person, place, and time.     GCS: GCS eye subscore is 4. GCS verbal subscore is 5. GCS motor subscore is 6.  Psychiatric:        Speech: Speech normal.        Behavior: Behavior normal. Behavior is cooperative.     ED Results and Treatments Labs (all labs ordered are listed, but only abnormal results are displayed) Labs Reviewed - No data to display                                                                                                                        Radiology No results found.  Pertinent labs & imaging results that were available during my care of the patient were reviewed by me and considered in my medical decision making (see MDM for details).  Medications Ordered in ED Medications  dexamethasone (DECADRON) tablet 8 mg (8 mg Oral Given 02/15/23 0809)  Procedures Procedures  (including critical care time)  Medical Decision Making / ED Course    Medical Decision Making:    Joseph Hardy is a 37 y.o. male  with past medical history as below, significant for no sig med hx who presents to the ED with complaint of rash. The complaint involves an extensive differential diagnosis and also carries with it a high risk of complications and morbidity.  Serious etiology was considered. Ddx includes but is not limited to: DDx for rash is broad and includes common causes of atopic dermatitis, contact dermatitis, drug eruption, erythema multiforme, fifth disease, psoriasis, insect bites, eczema, pityriasis  rosea, roseola, scabies, tinea corporis, urticaria, varicella, viral exanthem. Uncommon causes of rash include bullous pemphigoid, dermatitis herpetiformis, HIV acute exanthem, Kawasaki disease, lupus, Lyme disease, meningococcemia, mycosis fungoides, RMSF, scarlet fever, secondary syphilis, SSSS, SJS, and toxic shock syndrome.   Complete initial physical exam performed, notably the patient  was NAD, sitting upright.    Reviewed and confirmed nursing documentation for past medical history, family history, social history.  Vital signs reviewed.        Pt here with rash, most c/w poison ivy dermitis; does not involve mucus membranes. Rash not c/w SJS, RMSF, lyme, TEN. No respiratory or airway involvement, no mucus membrane involvement. He is HDS  Give steroids here, steroids for home, topical steroid and oral antihistamine for home  Advised to wash clothing or tools used during work where he was exposed  The patient improved significantly and was discharged in stable condition. Detailed discussions were had with the patient regarding current findings, and need for close f/u with PCP or on call doctor. The patient has been instructed to return immediately if the symptoms worsen in any way for re-evaluation. Patient verbalized understanding and is in agreement with current care plan. All questions answered prior to discharge.                   Additional history obtained: -Additional history obtained from na -External records from outside source obtained and reviewed including: Chart review including previous notes, labs, imaging, consultation notes including  Prior ed visits    Lab Tests: na  EKG   EKG Interpretation Date/Time:    Ventricular Rate:    PR Interval:    QRS Duration:    QT Interval:    QTC Calculation:   R Axis:      Text Interpretation:           Imaging Studies ordered: na   Medicines ordered and prescription drug management: Meds  ordered this encounter  Medications   dexamethasone (DECADRON) tablet 8 mg   predniSONE (DELTASONE) 20 MG tablet    Sig: Take 2 tablets (40 mg total) by mouth daily for 5 days.    Dispense:  10 tablet    Refill:  0   cetirizine (ZYRTEC ALLERGY) 10 MG tablet    Sig: Take 1 tablet (10 mg total) by mouth daily for 14 days.    Dispense:  14 tablet    Refill:  0   Clobetasol Propionate 0.05 % lotion    Sig: Apply 1 Application topically 2 (two) times daily for 7 days. Do not apply to face or genitals    Dispense:  59 mL    Refill:  0    -I have reviewed the patients home medicines and have made adjustments as needed   Consultations Obtained: na   Cardiac Monitoring: Continuous pulse oximetry interpreted by myself, 99%  on RA.    Social Determinants of Health:  Diagnosis or treatment significantly limited by social determinants of health: former smoker no pcp, no insurance   Reevaluation: After the interventions noted above, I reevaluated the patient and found that they have stayed the same  Co morbidities that complicate the patient evaluation History reviewed. No pertinent past medical history.    Dispostion: Disposition decision including need for hospitalization was considered, and patient discharged from emergency department.    Final Clinical Impression(s) / ED Diagnoses Final diagnoses:  Poison ivy dermatitis        Sloan Leiter, DO 02/15/23 [phone]

## 2023-02-15 NOTE — ED Notes (Signed)
ED Provider at bedside. 

## 2023-02-15 NOTE — ED Triage Notes (Addendum)
Pt states he was cutting down a tree yesterday and was in some overgrown areas, started noticing a rash on his arm yesterday. Woke up to rash on both arms and legs. Right arm has multiple oozing blisters. Denies any known allergies

## 2023-02-15 NOTE — ED Notes (Signed)
Pt verbalized understanding of discharge instructions. Opportunity for questions provided.
